# Patient Record
Sex: Female | Born: 1985 | Race: White | Hispanic: No | Marital: Married | Smoking: Former smoker
Health system: Southern US, Community
[De-identification: ages and names within clinical notes are randomized; demographics above are authoritative.]

## PROBLEM LIST (undated history)

## (undated) DIAGNOSIS — Z789 Other specified health status: Secondary | ICD-10-CM

## (undated) DIAGNOSIS — E785 Hyperlipidemia, unspecified: Secondary | ICD-10-CM

## (undated) DIAGNOSIS — K8050 Calculus of bile duct without cholangitis or cholecystitis without obstruction: Secondary | ICD-10-CM

## (undated) DIAGNOSIS — K219 Gastro-esophageal reflux disease without esophagitis: Secondary | ICD-10-CM

## (undated) DIAGNOSIS — N200 Calculus of kidney: Secondary | ICD-10-CM

## (undated) DIAGNOSIS — Z87442 Personal history of urinary calculi: Secondary | ICD-10-CM

## (undated) DIAGNOSIS — K8020 Calculus of gallbladder without cholecystitis without obstruction: Secondary | ICD-10-CM

## (undated) HISTORY — DX: Calculus of kidney: N20.0

## (undated) HISTORY — DX: Hyperlipidemia, unspecified: E78.5

---
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
## 2006-01-10 DIAGNOSIS — Z87410 Personal history of cervical dysplasia: Secondary | ICD-10-CM

## 2006-01-10 HISTORY — DX: Personal history of cervical dysplasia: Z87.410

## 2006-01-10 HISTORY — PX: CERVICAL CONIZATION W/BX: SHX1330

## 2006-06-30 ENCOUNTER — Emergency Department (HOSPITAL_COMMUNITY): Admission: EM | Admit: 2006-06-30 | Discharge: 2006-06-30 | Payer: Self-pay | Admitting: Emergency Medicine

## 2006-08-30 ENCOUNTER — Ambulatory Visit (HOSPITAL_COMMUNITY): Admission: RE | Admit: 2006-08-30 | Discharge: 2006-08-30 | Payer: Self-pay | Admitting: Obstetrics & Gynecology

## 2006-08-30 HISTORY — PX: LASER ABLATION OF THE CERVIX: SHX1949

## 2006-11-28 ENCOUNTER — Other Ambulatory Visit: Admission: RE | Admit: 2006-11-28 | Discharge: 2006-11-28 | Payer: Self-pay | Admitting: Obstetrics and Gynecology

## 2007-09-12 ENCOUNTER — Inpatient Hospital Stay (HOSPITAL_COMMUNITY): Admission: AD | Admit: 2007-09-12 | Discharge: 2007-09-12 | Payer: Self-pay | Admitting: Obstetrics & Gynecology

## 2007-09-16 ENCOUNTER — Inpatient Hospital Stay (HOSPITAL_COMMUNITY): Admission: AD | Admit: 2007-09-16 | Discharge: 2007-09-18 | Payer: Self-pay | Admitting: Obstetrics & Gynecology

## 2007-09-16 ENCOUNTER — Ambulatory Visit: Payer: Self-pay | Admitting: Obstetrics and Gynecology

## 2007-12-26 ENCOUNTER — Other Ambulatory Visit: Admission: RE | Admit: 2007-12-26 | Discharge: 2007-12-26 | Payer: Self-pay | Admitting: Obstetrics and Gynecology

## 2008-03-15 ENCOUNTER — Emergency Department (HOSPITAL_COMMUNITY): Admission: EM | Admit: 2008-03-15 | Discharge: 2008-03-15 | Payer: Self-pay | Admitting: Emergency Medicine

## 2008-03-19 ENCOUNTER — Ambulatory Visit (HOSPITAL_COMMUNITY): Admission: RE | Admit: 2008-03-19 | Discharge: 2008-03-19 | Payer: Self-pay | Admitting: Family Medicine

## 2009-02-02 ENCOUNTER — Other Ambulatory Visit: Admission: RE | Admit: 2009-02-02 | Discharge: 2009-02-02 | Payer: Self-pay | Admitting: Obstetrics and Gynecology

## 2009-08-19 ENCOUNTER — Ambulatory Visit: Payer: Self-pay | Admitting: Family Medicine

## 2009-08-19 ENCOUNTER — Inpatient Hospital Stay (HOSPITAL_COMMUNITY): Admission: AD | Admit: 2009-08-19 | Discharge: 2009-08-19 | Payer: Self-pay | Admitting: Obstetrics and Gynecology

## 2009-09-04 ENCOUNTER — Ambulatory Visit: Payer: Self-pay | Admitting: Advanced Practice Midwife

## 2009-09-04 ENCOUNTER — Inpatient Hospital Stay (HOSPITAL_COMMUNITY): Admission: AD | Admit: 2009-09-04 | Discharge: 2009-09-06 | Payer: Self-pay | Admitting: Obstetrics and Gynecology

## 2009-11-25 ENCOUNTER — Emergency Department (HOSPITAL_COMMUNITY): Admission: EM | Admit: 2009-11-25 | Discharge: 2009-11-25 | Payer: Self-pay | Admitting: Emergency Medicine

## 2010-02-17 ENCOUNTER — Other Ambulatory Visit (HOSPITAL_COMMUNITY)
Admission: RE | Admit: 2010-02-17 | Discharge: 2010-02-17 | Disposition: A | Discharge status: Home / Self Care | Payer: Self-pay | Source: Ambulatory Visit | Attending: Obstetrics and Gynecology | Admitting: Obstetrics and Gynecology

## 2010-02-17 ENCOUNTER — Other Ambulatory Visit: Payer: Self-pay | Admitting: Adult Health

## 2010-02-17 DIAGNOSIS — Z01419 Encounter for gynecological examination (general) (routine) without abnormal findings: Secondary | ICD-10-CM | POA: Insufficient documentation

## 2010-03-23 LAB — D-DIMER, QUANTITATIVE: D-Dimer, Quant: <0.22 ug/mL-FEU (ref 0.00–0.48)

## 2010-03-23 LAB — POCT I-STAT, CHEM 8
BUN: 21 mg/dL (ref 6–23)
Calcium, Ion: 1.14 mmol/L (ref 1.12–1.32)
Chloride: 105 mEq/L (ref 96–112)
Creatinine, Ser: 1 mg/dL (ref 0.4–1.2)
Glucose, Bld: 97 mg/dL (ref 70–99)
TCO2: 25 mmol/L (ref 0–100)

## 2010-03-26 LAB — CBC
HCT: 37.5 % (ref 36.0–46.0)
MCH: 27.5 pg (ref 26.0–34.0)
MCHC: 34 g/dL (ref 30.0–36.0)
MCV: 81.1 fL (ref 78.0–100.0)
Platelets: 198 10*3/uL (ref 150–400)
RDW: 16.3 % — ABNORMAL HIGH (ref 11.5–15.5)
WBC: 11.7 10*3/uL — ABNORMAL HIGH (ref 4.0–10.5)

## 2010-03-26 LAB — URINALYSIS, ROUTINE W REFLEX MICROSCOPIC
Bilirubin Urine: NEGATIVE
Glucose, UA: NEGATIVE mg/dL
Hgb urine dipstick: NEGATIVE
Ketones, ur: 15 mg/dL — AB
Protein, ur: NEGATIVE mg/dL
Urobilinogen, UA: 1 mg/dL (ref 0.0–1.0)

## 2010-03-26 LAB — URINE MICROSCOPIC-ADD ON

## 2010-04-22 LAB — D-DIMER, QUANTITATIVE: D-Dimer, Quant: 0.36 ug/mL-FEU (ref 0.00–0.48)

## 2010-05-25 NOTE — H&P (Signed)
NAME:  Nichole [name], MCCAIG NO.:  [id number]   MEDICAL RECORD NO.:  [id number]          PATIENT TYPE:  AMB   LOCATION:  DAY                           FACILITY:  [facility]   PHYSICIAN:  Lazaro Arms, M.D.   DATE OF BIRTH:  Oct 11, 1985   DATE OF ADMISSION:  DATE OF DISCHARGE:  LH                              HISTORY & PHYSICAL   Elvenia is a 25 year old white female who had a Pap smear which showed  atypical squamous cells with positive HPV.  That was actually obtained  at time of the pregnancy.  In the meantime, she suffered a pregnancy  loss and miscarriage.  And then after that I did a colposcopy with  biopsy.  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  As a result, she is admitted  for laser ablation oft cervix as an outpatient.   PAST MEDICAL HISTORY:  Negative.   PAST SURGICAL HISTORY:  Negative.   PAST OB HISTORY:  She has had one miscarriage.   ALLERGIES:  None.   MEDICATIONS:  None.   REVIEW OF SYSTEMS:  Negative.   PHYSICAL EXAMINATION:  HEENT:  Unremarkable.  NECK:  Thyroid is normal.  LUNGS:  Clear.  HEART:  Regular rate and rhythm without regurgitation or gallop.  BREASTS:  Deferred.  ABDOMEN:  Benign.  PELVIC:  Normal.  EXTREMITIES:  No edema.  NEUROLOGIC:  Grossly intact.   IMPRESSION:  High grade dysplasia.   PLAN:  The patient is admitted for laser ablation of the cervix.  She  understands the indications, risks, benefits and will proceed.      Lazaro Arms, M.D.  Electronically Signed     LHE/MEDQ  D:  08/29/2006  T:  08/30/2006  Job:  [id number]

## 2010-05-25 NOTE — Op Note (Signed)
NAMEREEYA, BOUND              ACCOUNT NO.:  [id number]   MEDICAL RECORD NO.:  [id number]          PATIENT TYPE:  AMB   LOCATION:  DAY                           FACILITY:  [facility]   PHYSICIAN:  Lazaro Arms, M.D.   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   PROCEDURE:  Laser ablation of cervix.   SURGEON:  Lazaro Arms, M.D.   ANESTHESIA:  General endotracheal.   FINDINGS:  The patient had a Pap smear that revealed ASCUS with positive  HPV. Did that colposcopically directed biopsies in the office which  revealed high-grade dysplasia with endocervical antral involvement.  As  a result she was admitted for laser ablation of cervix.   DESCRIPTION OF OPERATION:  The patient taken to the operating room,  placed in supine position underwent general endotracheal anesthesia,  placed in dorsal supine position.  Graves speculum was placed.  A  holmium laser was used power of 1.5 with rate of 20 per second. A good  margin was obtained around the dysplasia, taken to a depth 5-7 mm  conically.  There was no bleeding.  Dilute Monsel's placed end the  procedure.  The patient tolerated procedure well.  She is awakened from  anesthesia doing well.      Lazaro Arms, M.D.  Electronically Signed     LHE/MEDQ  D:  08/30/2006  T:  08/31/2006  Job:  [id number]

## 2010-10-13 LAB — CBC
Hemoglobin: 12.7
MCHC: 32.1
RBC: 5.11

## 2010-10-13 LAB — RPR: RPR Ser Ql: NONREACTIVE

## 2010-10-22 LAB — CBC
HCT: 36.7
Platelets: 333
RBC: 4.78
WBC: 10

## 2010-10-22 LAB — URINALYSIS, ROUTINE W REFLEX MICROSCOPIC
Nitrite: NEGATIVE
Specific Gravity, Urine: 1.02
Urobilinogen, UA: 0.2
pH: 6

## 2010-10-22 LAB — URINE MICROSCOPIC-ADD ON

## 2010-10-22 LAB — HCG, QUANTITATIVE, PREGNANCY: hCG, Beta Chain, Quant, S: <2

## 2011-01-11 NOTE — L&D Delivery Note (Addendum)
Delivery Note At 8:03 AM a viable female was delivered via Vaginal, Spontaneous Delivery (Presentation: Right Occiput Anterior) through loose nuchal cord after approximately 1.67min shoulder dystocia. 1st maneuver: mcroberts, 2nd maneuver: mcroberts w/ suprapubic pressure, 3rd maneuver: manual rotation of posterior shoulder with subsequent release of anterior shoulder.    Infant moving both arms symmetrically.  APGAR: 8, 9; weight 8 lb 12.9 oz (3995 g).   Placenta status: Intact, Spontaneous.  Cord: 3 vessels with hypospirality    Anesthesia: Epidural  Episiotomy: None Lacerations: 2nd degree Suture Repair: 3.66monocryl Est. Blood Loss (mL):  Mom to postpartum.  Baby to nursery-stable.  Plans to bottlefeed, vasectomy for contraception.  OP circumcision at FT.   Nichole [name], Nichole [name] 11/27/2011, 8:34 AM

## 2011-03-01 ENCOUNTER — Other Ambulatory Visit (HOSPITAL_COMMUNITY)
Admission: RE | Admit: 2011-03-01 | Discharge: 2011-03-01 | Disposition: A | Discharge status: Home / Self Care | Payer: Managed Care, Other (non HMO) | Source: Ambulatory Visit | Attending: Obstetrics and Gynecology | Admitting: Obstetrics and Gynecology

## 2011-03-01 ENCOUNTER — Other Ambulatory Visit: Payer: Self-pay | Admitting: Adult Health

## 2011-03-01 DIAGNOSIS — Z01419 Encounter for gynecological examination (general) (routine) without abnormal findings: Secondary | ICD-10-CM | POA: Insufficient documentation

## 2011-04-05 LAB — OB RESULTS CONSOLE ABO/RH: RH Type: POSITIVE

## 2011-04-05 LAB — OB RESULTS CONSOLE ANTIBODY SCREEN: Antibody Screen: NEGATIVE

## 2011-04-05 LAB — OB RESULTS CONSOLE HEPATITIS B SURFACE ANTIGEN: Hepatitis B Surface Ag: NEGATIVE

## 2011-04-05 LAB — OB RESULTS CONSOLE RPR: RPR: NONREACTIVE

## 2011-11-26 ENCOUNTER — Encounter (HOSPITAL_COMMUNITY): Payer: Self-pay | Admitting: *Deleted

## 2011-11-26 ENCOUNTER — Inpatient Hospital Stay (HOSPITAL_COMMUNITY)
Admission: AD | Admit: 2011-11-26 | Discharge: 2011-11-26 | Disposition: A | Discharge status: Home / Self Care | Payer: Managed Care, Other (non HMO) | Source: Ambulatory Visit | Attending: Obstetrics & Gynecology | Admitting: Obstetrics & Gynecology

## 2011-11-26 DIAGNOSIS — O479 False labor, unspecified: Secondary | ICD-10-CM | POA: Insufficient documentation

## 2011-11-26 HISTORY — DX: Other specified health status: Z78.9

## 2011-11-26 MED ORDER — PROMETHAZINE HCL 25 MG PO TABS: 25.0000 mg | ORAL_TABLET | Freq: Four times a day (QID) | ORAL | Status: DC | PRN

## 2011-11-26 NOTE — MAU Note (Signed)
Contractions started @0330 .  Getting stronger and closer, now every 3-5 min.  No bleeding or leaking, was 1 cm when last checked.

## 2011-11-27 ENCOUNTER — Encounter (HOSPITAL_COMMUNITY): Payer: Self-pay | Admitting: Family Medicine

## 2011-11-27 ENCOUNTER — Inpatient Hospital Stay (HOSPITAL_COMMUNITY): Payer: Managed Care, Other (non HMO) | Admitting: Anesthesiology

## 2011-11-27 ENCOUNTER — Inpatient Hospital Stay (HOSPITAL_COMMUNITY)
Admission: AD | Admit: 2011-11-27 | Discharge: 2011-11-28 | DRG: 775 | Disposition: A | Discharge status: Home / Self Care | Payer: Managed Care, Other (non HMO) | Source: Ambulatory Visit | Attending: Obstetrics & Gynecology | Admitting: Obstetrics & Gynecology

## 2011-11-27 ENCOUNTER — Encounter (HOSPITAL_COMMUNITY): Payer: Self-pay | Admitting: Anesthesiology

## 2011-11-27 LAB — CBC
MCV: 77.6 fL — ABNORMAL LOW (ref 78.0–100.0)
Platelets: 214 10*3/uL (ref 150–400)
RBC: 5.18 MIL/uL — ABNORMAL HIGH (ref 3.87–5.11)
RDW: 15.4 % (ref 11.5–15.5)
WBC: 16.8 10*3/uL — ABNORMAL HIGH (ref 4.0–10.5)

## 2011-11-27 LAB — RPR: RPR Ser Ql: NONREACTIVE

## 2011-11-27 MED ORDER — OXYTOCIN BOLUS FROM INFUSION
500.0000 mL | INTRAVENOUS | Status: DC
  Administered 2011-11-27: 500 mL via INTRAVENOUS

## 2011-11-27 MED ORDER — FLEET ENEMA 7-19 GM/118ML RE ENEM: 1.0000 | ENEMA | Freq: Every day | RECTAL | Status: DC | PRN

## 2011-11-27 MED ORDER — LANOLIN HYDROUS EX OINT: TOPICAL_OINTMENT | CUTANEOUS | Status: DC | PRN

## 2011-11-27 MED ORDER — ACETAMINOPHEN 325 MG PO TABS: 650.0000 mg | ORAL_TABLET | ORAL | Status: DC | PRN

## 2011-11-27 MED ORDER — OXYCODONE-ACETAMINOPHEN 5-325 MG PO TABS: 1.0000 | ORAL_TABLET | ORAL | Status: DC | PRN

## 2011-11-27 MED ORDER — CITRIC ACID-SODIUM CITRATE 334-500 MG/5ML PO SOLN: 30.0000 mL | ORAL | Status: DC | PRN

## 2011-11-27 MED ORDER — BENZOCAINE-MENTHOL 20-0.5 % EX AERO
1.0000 "application " | INHALATION_SPRAY | CUTANEOUS | Status: DC | PRN
  Administered 2011-11-27: 1 via TOPICAL
  Filled 2011-11-27: qty 56

## 2011-11-27 MED ORDER — TETANUS-DIPHTH-ACELL PERTUSSIS 5-2.5-18.5 LF-MCG/0.5 IM SUSP: 0.5000 mL | Freq: Once | INTRAMUSCULAR | Status: DC

## 2011-11-27 MED ORDER — FENTANYL 2.5 MCG/ML BUPIVACAINE 1/10 % EPIDURAL INFUSION (WH - ANES)
14.0000 mL/h | INTRAMUSCULAR | Status: DC
  Administered 2011-11-27: 14 mL/h via EPIDURAL
  Filled 2011-11-27: qty 125

## 2011-11-27 MED ORDER — BISACODYL 10 MG RE SUPP: 10.0000 mg | Freq: Every day | RECTAL | Status: DC | PRN

## 2011-11-27 MED ORDER — LACTATED RINGERS IV SOLN
INTRAVENOUS | Status: DC
  Administered 2011-11-27: 125 mL/h via INTRAVENOUS

## 2011-11-27 MED ORDER — ONDANSETRON HCL 4 MG PO TABS: 4.0000 mg | ORAL_TABLET | ORAL | Status: DC | PRN

## 2011-11-27 MED ORDER — IBUPROFEN 600 MG PO TABS: 600.0000 mg | ORAL_TABLET | Freq: Four times a day (QID) | ORAL | Status: DC | PRN

## 2011-11-27 MED ORDER — IBUPROFEN 600 MG PO TABS
600.0000 mg | ORAL_TABLET | Freq: Four times a day (QID) | ORAL | Status: DC
  Administered 2011-11-27 – 2011-11-28 (×5): 600 mg via ORAL
  Filled 2011-11-27 (×5): qty 1

## 2011-11-27 MED ORDER — ONDANSETRON HCL 4 MG/2ML IJ SOLN: 4.0000 mg | INTRAMUSCULAR | Status: DC | PRN

## 2011-11-27 MED ORDER — LIDOCAINE HCL (PF) 1 % IJ SOLN
INTRAMUSCULAR | Status: DC | PRN
  Administered 2011-11-27 (×4): 4 mL

## 2011-11-27 MED ORDER — SODIUM CHLORIDE 0.9 % IV SOLN: 250.0000 mL | INTRAVENOUS | Status: DC | PRN

## 2011-11-27 MED ORDER — SODIUM CHLORIDE 0.9 % IJ SOLN: 3.0000 mL | INTRAMUSCULAR | Status: DC | PRN

## 2011-11-27 MED ORDER — MEASLES, MUMPS & RUBELLA VAC ~~LOC~~ INJ: 0.5000 mL | INJECTION | Freq: Once | SUBCUTANEOUS | Status: DC

## 2011-11-27 MED ORDER — WITCH HAZEL-GLYCERIN EX PADS: 1.0000 "application " | MEDICATED_PAD | CUTANEOUS | Status: DC | PRN

## 2011-11-27 MED ORDER — EPHEDRINE 5 MG/ML INJ: 10.0000 mg | INTRAVENOUS | Status: DC | PRN

## 2011-11-27 MED ORDER — PHENYLEPHRINE 40 MCG/ML (10ML) SYRINGE FOR IV PUSH (FOR BLOOD PRESSURE SUPPORT)
80.0000 ug | PREFILLED_SYRINGE | INTRAVENOUS | Status: DC | PRN
  Filled 2011-11-27: qty 5

## 2011-11-27 MED ORDER — SENNOSIDES-DOCUSATE SODIUM 8.6-50 MG PO TABS
2.0000 | ORAL_TABLET | Freq: Every day | ORAL | Status: DC
  Administered 2011-11-27: 2 via ORAL

## 2011-11-27 MED ORDER — ONDANSETRON HCL 4 MG/2ML IJ SOLN: 4.0000 mg | Freq: Four times a day (QID) | INTRAMUSCULAR | Status: DC | PRN

## 2011-11-27 MED ORDER — ZOLPIDEM TARTRATE 5 MG PO TABS: 5.0000 mg | ORAL_TABLET | Freq: Every evening | ORAL | Status: DC | PRN

## 2011-11-27 MED ORDER — SIMETHICONE 80 MG PO CHEW: 80.0000 mg | CHEWABLE_TABLET | ORAL | Status: DC | PRN

## 2011-11-27 MED ORDER — OXYTOCIN 40 UNITS IN LACTATED RINGERS INFUSION - SIMPLE MED
62.5000 mL/h | INTRAVENOUS | Status: DC
Start: 2011-11-27 — End: 2011-11-27
  Filled 2011-11-27: qty 1000

## 2011-11-27 MED ORDER — FLEET ENEMA 7-19 GM/118ML RE ENEM: 1.0000 | ENEMA | RECTAL | Status: DC | PRN

## 2011-11-27 MED ORDER — DIPHENHYDRAMINE HCL 25 MG PO CAPS: 25.0000 mg | ORAL_CAPSULE | Freq: Four times a day (QID) | ORAL | Status: DC | PRN

## 2011-11-27 MED ORDER — PRENATAL MULTIVITAMIN CH
1.0000 | ORAL_TABLET | Freq: Every day | ORAL | Status: DC
  Administered 2011-11-27 – 2011-11-28 (×2): 1 via ORAL
  Filled 2011-11-27 (×2): qty 1

## 2011-11-27 MED ORDER — SODIUM CHLORIDE 0.9 % IJ SOLN: 3.0000 mL | Freq: Two times a day (BID) | INTRAMUSCULAR | Status: DC

## 2011-11-27 MED ORDER — DIBUCAINE 1 % RE OINT: 1.0000 "application " | TOPICAL_OINTMENT | RECTAL | Status: DC | PRN

## 2011-11-27 MED ORDER — DIPHENHYDRAMINE HCL 50 MG/ML IJ SOLN: 12.5000 mg | INTRAMUSCULAR | Status: DC | PRN

## 2011-11-27 MED ORDER — EPHEDRINE 5 MG/ML INJ
10.0000 mg | INTRAVENOUS | Status: DC | PRN
  Filled 2011-11-27: qty 4

## 2011-11-27 MED ORDER — LACTATED RINGERS IV SOLN: 500.0000 mL | INTRAVENOUS | Status: DC | PRN

## 2011-11-27 MED ORDER — PHENYLEPHRINE 40 MCG/ML (10ML) SYRINGE FOR IV PUSH (FOR BLOOD PRESSURE SUPPORT): 80.0000 ug | PREFILLED_SYRINGE | INTRAVENOUS | Status: DC | PRN

## 2011-11-27 MED ORDER — PANTOPRAZOLE SODIUM 40 MG PO TBEC
40.0000 mg | DELAYED_RELEASE_TABLET | Freq: Every day | ORAL | Status: DC
  Filled 2011-11-27 (×2): qty 1

## 2011-11-27 MED ORDER — LIDOCAINE HCL (PF) 1 % IJ SOLN
30.0000 mL | INTRAMUSCULAR | Status: DC | PRN
  Administered 2011-11-27: 30 mL via SUBCUTANEOUS
  Filled 2011-11-27: qty 30

## 2011-11-27 MED ORDER — LACTATED RINGERS IV SOLN
500.0000 mL | Freq: Once | INTRAVENOUS | Status: AC
  Administered 2011-11-27: 500 mL via INTRAVENOUS

## 2011-11-27 MED ORDER — OXYTOCIN 40 UNITS IN LACTATED RINGERS INFUSION - SIMPLE MED: 62.5000 mL/h | INTRAVENOUS | Status: DC | PRN

## 2011-11-27 NOTE — MAU Note (Signed)
Patient is in for labor eval. She states that she was seen in mau at 8am and was 2.5cm. She states that she is having ctx q2-17m since midnight. She denies lof or vaginal bleeding. She reports good fetal movement.

## 2011-11-27 NOTE — Anesthesia Procedure Notes (Signed)
Epidural Patient location during procedure: OB Start time: 11/27/2011 6:08 AM  Staffing Performed by: anesthesiologist   Preanesthetic Checklist Completed: patient identified, site marked, surgical consent, pre-op evaluation, timeout performed, IV checked, risks and benefits discussed and monitors and equipment checked  Epidural Patient position: sitting Prep: site prepped and draped and DuraPrep Patient monitoring: continuous pulse ox and blood pressure Approach: midline Injection technique: LOR air  Needle:  Needle type: Tuohy  Needle gauge: 17 G Needle length: 9 cm and 9 Needle insertion depth: 6 cm Catheter type: closed end flexible Catheter size: 19 Gauge Catheter at skin depth: 11 cm Test dose: negative  Assessment Events: blood not aspirated, injection not painful, no injection resistance, negative IV test and no paresthesia  Additional Notes Discussed risk of headache, infection, bleeding, nerve injury and failed or incomplete block.  Patient voices understanding and wishes to proceed. Reason for block:procedure for pain

## 2011-11-27 NOTE — Anesthesia Postprocedure Evaluation (Signed)
  Anesthesia Post-op Note  Patient: Nichole [name]  Procedure(s) Performed: * No procedures listed *  Patient Location: Mother/Baby  Anesthesia Type:Epidural  Level of Consciousness: awake, alert  and oriented  Airway and Oxygen Therapy: Patient Spontanous Breathing  Post-op Pain: mild  Post-op Assessment: Patient's Cardiovascular Status Stable, Respiratory Function Stable, Patent Airway, No signs of Nausea or vomiting and Pain level controlled  Post-op Vital Signs: stable  Complications: No apparent anesthesia complications

## 2011-11-27 NOTE — Progress Notes (Signed)
Nichole [name] is a 26 y.o. [phone] at [redacted]w[redacted]d admitted for active labor  Subjective: Comfortable w/ epidural, starting to feel a little pressure.  Husband supportive at bs.  Objective: BP 102/64  Pulse 114  Temp 98 F (36.7 C) (Oral)  Resp 16  Ht 5\' 4"  (1.626 m)  Wt 84.086 kg (185 lb 6 oz)  BMI 31.82 kg/m2  SpO2 100%      FHT:  FHR: 140 bpm, variability: moderate,  accelerations:  Present,  decelerations:  Present earlies UC:   irregular, every 2-5 minutes SVE:   Dilation: 6.5 Effacement (%): 90 Station: 0 Exam by:: K.Aayush Gelpi CNM AROM small amount clear fluid  Labs: Lab Results  Component Value Date   WBC 16.8* 11/27/2011   HGB 13.3 11/27/2011   HCT 40.2 11/27/2011   MCV 77.6* 11/27/2011   PLT 214 11/27/2011    Assessment / Plan: Spontaneous labor, progressing normally, now arom'd  Labor: Progressing normally Preeclampsia:  n/a Fetal Wellbeing:  Category I Pain Control:  Epidural I/D:  n/a Anticipated MOD:  NSVD  Kaydon, Kalbfleisch 11/27/2011, 7:12 AM

## 2011-11-27 NOTE — H&P (Signed)
Nichole [name] is a 26 y.o. [phone] female at [redacted]w[redacted]d by 6wk2d u/s, presenting for uc's that began yesterday that have progressively increased in frequency and intensity.  Denies LOF or VB. Good fm.  Was evaluated in MAU 11/16 am, was 2.5/50/-2 and didn't make cervical change so was d/c'd. Has now changed from 4/90/-2 to 4.5/90/-2 in an hour.  Regular pnc @ FT beginning at [facility].  Normal AFP and anatomy u/s, 2hr gtt 95,621,308, GBS neg.  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 H/O term svd x 2, pelvis proven to 9lb10oz.  Maternal Medical History:  Reason for admission: Reason for admission: contractions.  Contractions: Onset was yesterday.   Frequency: rare.   Perceived severity is moderate.    Fetal activity: Perceived fetal activity is normal.   Last perceived fetal movement was within the past hour.    Prenatal Complications - Diabetes: none.    OB History    Grav Para Term Preterm Abortions TAB SAB Ect Mult Living   4 2 2  0 1 0 1 0 0 2     Past Medical History  Diagnosis Date  . No pertinent past medical history    History reviewed. No pertinent past surgical history. Family History: family history is not on file. Social History:  reports that she has never smoked. She does not have any smokeless tobacco history on file. She reports that she does not drink alcohol or use illicit drugs.   Prenatal Transfer Tool  Maternal Diabetes: No Genetic Screening: Normal Maternal Ultrasounds/Referrals: Normal Fetal Ultrasounds or other Referrals:  None Maternal Substance Abuse:  No Significant Maternal Medications:  None Significant Maternal Lab Results:  Lab values include: Group B Strep negative Other Comments:  Heterozygous CF carrier, FOB neg  Review of Systems  Constitutional: Negative.   HENT: Negative.   Eyes: Negative.   Respiratory: Negative.   Cardiovascular: Negative.   Gastrointestinal: Positive for abdominal pain (r/t uc's).  Genitourinary: Negative.     Musculoskeletal: Negative.   Skin: Negative.   Neurological: Negative.   Endo/Heme/Allergies: Negative.   Psychiatric/Behavioral: Negative.     Dilation: 4.5 Effacement (%): 90 Station: -2 Exam by:: Peace, rn Blood pressure 115/71, pulse 102, temperature 97.7 F (36.5 C), temperature source Oral, resp. rate 18, height 5\' 4"  (1.626 m), weight 84.086 kg (185 lb 6 oz), SpO2 98.00%. Maternal Exam:  Uterine Assessment: Contraction strength is moderate.  Contraction frequency is regular.   Abdomen: Patient reports no abdominal tenderness. Fetal presentation: vertex     Fetal Exam Fetal Monitor Review: Mode: ultrasound.   Baseline rate: 145.  Variability: moderate (6-25 bpm).   Pattern: accelerations present and early decelerations.    Fetal State Assessment: Category I - tracings are normal.     Physical Exam  Constitutional: She is oriented to person, place, and time. She appears well-developed and well-nourished.  HENT:  Head: Normocephalic.  Neck: Normal range of motion.  Cardiovascular: Normal rate and regular rhythm.   Respiratory: Effort normal and breath sounds normal.  GI: Soft.       gravid  Genitourinary:       SVE by RN  Musculoskeletal: Normal range of motion.  Neurological: She is alert and oriented to person, place, and time. She has normal reflexes.  Skin: Skin is warm and dry.  Psychiatric: She has a normal mood and affect. Her behavior is normal. Judgment and thought content normal.    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

## 2011-11-27 NOTE — Anesthesia Preprocedure Evaluation (Signed)
Anesthesia Evaluation  Patient identified by MRN, date of birth, ID band Patient awake    Reviewed: Allergy & Precautions, H&P , NPO status , Patient's Chart, lab work & pertinent test results, reviewed documented beta blocker date and time   History of Anesthesia Complications Negative for: history of anesthetic complications  Airway Mallampati: I TM Distance: >3 FB Neck ROM: full    Dental  (+) Teeth Intact   Pulmonary neg pulmonary ROS,  breath sounds clear to auscultation        Cardiovascular negative cardio ROS  Rhythm:regular Rate:Normal     Neuro/Psych negative neurological ROS  negative psych ROS   GI/Hepatic Neg liver ROS, GERD-  Medicated,  Endo/Other  negative endocrine ROS  Renal/GU negative Renal ROS     Musculoskeletal   Abdominal   Peds  Hematology negative hematology ROS (+)   Anesthesia Other Findings   Reproductive/Obstetrics (+) Pregnancy                           Anesthesia Physical Anesthesia Plan  ASA: II  Anesthesia Plan: Epidural   Post-op Pain Management:    Induction:   Airway Management Planned:   Additional Equipment:   Intra-op Plan:   Post-operative Plan:   Informed Consent: I have reviewed the patients History and Physical, chart, labs and discussed the procedure including the risks, benefits and alternatives for the proposed anesthesia with the patient or authorized representative who has indicated his/her understanding and acceptance.     Plan Discussed with:   Anesthesia Plan Comments:         Anesthesia Quick Evaluation  

## 2011-11-28 MED ORDER — IBUPROFEN 600 MG PO TABS: 600.0000 mg | ORAL_TABLET | Freq: Four times a day (QID) | ORAL | Status: DC

## 2011-11-28 NOTE — Discharge Summary (Signed)
Obstetric Discharge Summary Reason for Admission: uterine contractions Prenatal Procedures: none Intrapartum Procedures: spontaneous vaginal delivery and shoulder dystocia x1.5 min, released with McRoberts/suprapubic pressure and rotation of post shoulder Postpartum Procedures: none Complications-Operative and Postpartum: vaginal laceration Hemoglobin  Date Value Range Status  11/27/2011 13.3  12.0 - 15.0 g/dL Final     HCT  Date Value Range Status  11/27/2011 40.2  36.0 - 46.0 % Final    Physical Exam:  General: alert, cooperative and no distress Lochia: appropriate Uterine Fundus: firm DVT Evaluation: No evidence of DVT seen on physical exam.  Discharge Diagnoses: Term Pregnancy-delivered  Discharge Information: Date: 11/28/2011 Activity: pelvic rest Diet: routine Medications: PNV and Ibuprofen Condition: stable Instructions: refer to practice specific booklet Discharge to: home Follow-up Information    Follow up with [facility]. Schedule an appointment as soon as possible for a visit in 4 weeks.   Contact information:   [address] [place] [address] [phone]         Newborn Data: Live born female  Birth Weight: 8 lb 12.9 oz (3995 g) APGAR: 8, 9  Home with mother. F/u with [facility] for outpt circ.  Street, Nichole [name] 11/28/2011, 9:09 AM  I have seen and examined this patient and I agree with the above. Nichole [name], Nichole [name] 9:37 PM 11/29/2011

## 2011-12-01 NOTE — Discharge Summary (Signed)
Attestation of Attending Supervision of Advanced Practitioner: Evaluation and management procedures were performed by the PA/NP/CNM/OB Fellow under my supervision/collaboration. Chart reviewed and agree with management and plan.  Danitza Schoenfeldt V 12/01/2011 5:21 AM    

## 2011-12-12 NOTE — H&P (Signed)
Agree with above note.  Nichole [name] H. 12/12/2011 9:24 PM

## 2012-03-28 ENCOUNTER — Encounter: Payer: Self-pay | Admitting: Adult Health

## 2012-03-28 ENCOUNTER — Ambulatory Visit (INDEPENDENT_AMBULATORY_CARE_PROVIDER_SITE_OTHER): Payer: Managed Care, Other (non HMO) | Admitting: Adult Health

## 2012-03-28 VITALS — BP 110/72 | Resp 20 | Ht 65.0 in | Wt 179.0 lb

## 2012-03-28 DIAGNOSIS — IMO0001 Reserved for inherently not codable concepts without codable children: Secondary | ICD-10-CM

## 2012-03-28 DIAGNOSIS — Z3041 Encounter for surveillance of contraceptive pills: Secondary | ICD-10-CM | POA: Insufficient documentation

## 2012-03-28 MED ORDER — NORGESTIM-ETH ESTRAD TRIPHASIC 0.18/0.215/0.25 MG-25 MCG PO TABS: 1.0000 | ORAL_TABLET | Freq: Every day | ORAL | Status: DC

## 2012-03-28 NOTE — Progress Notes (Signed)
Nichole [name] is here today to restart birth control pills. Her husband was planning a vasectomy and that did not happen. She will start tri-Sprintec today, and she is encouraged to use condoms for 2 weeks. Impression: Contraceptive management. Plan: Start pills today and use condoms. Any problems

## 2012-03-28 NOTE — Patient Instructions (Signed)
Start pills today, use condoms for 2 weeks call any problems

## 2012-04-30 ENCOUNTER — Telehealth: Payer: Self-pay | Admitting: Adult Health

## 2012-05-01 NOTE — Telephone Encounter (Signed)
Pt states having itching in her face, hands, and ears x 2 days, thinks it could be her BCP, Orthotricycline, stated has not changed soap or detergents. Not having itching this morning.  Could this be a side effect of the BCP? Please advise.

## 2012-05-01 NOTE — Telephone Encounter (Signed)
Had itching on face and hands for about 3 days in a row, none today. Has been on OCs for 5 weeks. No changes in routine, she thinks now it could be the pollen or allergies. And I agree.

## 2012-08-29 ENCOUNTER — Other Ambulatory Visit (HOSPITAL_COMMUNITY): Payer: Self-pay | Admitting: Physician Assistant

## 2012-08-29 DIAGNOSIS — R109 Unspecified abdominal pain: Secondary | ICD-10-CM

## 2012-08-31 ENCOUNTER — Ambulatory Visit (HOSPITAL_COMMUNITY)
Admission: RE | Admit: 2012-08-31 | Discharge: 2012-08-31 | Disposition: A | Discharge status: Home / Self Care | Payer: Managed Care, Other (non HMO) | Source: Ambulatory Visit | Attending: Physician Assistant | Admitting: Physician Assistant

## 2012-08-31 DIAGNOSIS — R110 Nausea: Secondary | ICD-10-CM | POA: Insufficient documentation

## 2012-08-31 DIAGNOSIS — R109 Unspecified abdominal pain: Secondary | ICD-10-CM

## 2012-08-31 DIAGNOSIS — K8020 Calculus of gallbladder without cholecystitis without obstruction: Secondary | ICD-10-CM | POA: Insufficient documentation

## 2012-11-27 ENCOUNTER — Ambulatory Visit (INDEPENDENT_AMBULATORY_CARE_PROVIDER_SITE_OTHER): Payer: Managed Care, Other (non HMO) | Admitting: Adult Health

## 2012-11-27 ENCOUNTER — Encounter: Payer: Self-pay | Admitting: Adult Health

## 2012-11-27 VITALS — BP 122/80 | Ht 65.0 in | Wt 171.0 lb

## 2012-11-27 DIAGNOSIS — N926 Irregular menstruation, unspecified: Secondary | ICD-10-CM | POA: Insufficient documentation

## 2012-11-27 MED ORDER — NORGESTIMATE-ETH ESTRADIOL 0.25-35 MG-MCG PO TABS: 1.0000 | ORAL_TABLET | Freq: Every day | ORAL | Status: DC

## 2012-11-27 NOTE — Patient Instructions (Signed)
Finish current pack of pills and then start sprintec Follow up prn Use condom

## 2012-11-27 NOTE — Progress Notes (Signed)
Subjective:     Patient ID: Nichole [name], female   DOB: [birth year], 27 y.o.   MRN: [id number]  HPI Nichole [name] is a 27 year old white female married in complaining of having irregular bleeding on the pill.No pain, no missed pills or other meds.  Review of Systems See HPI Reviewed past medical,surgical, social and family history. Reviewed medications and allergies.     Objective:   Physical Exam BP 122/80  Ht 5\' 5"  (1.651 m)  Wt 171 lb (77.565 kg)  BMI 28.46 kg/m2  LMP 11/19/2012  Breastfeeding? No Skin warm and dry.Pelvic: external genitalia is normal in appearance, vagina: period like blood without odor, cervix:smooth and bulbous, uterus: normal size, shape and contour, non tender, no masses felt, adnexa: no masses or tenderness noted.    Assessment:    Irregular bleeding    Plan:    Rx sprintec x 1 year, finish current pack of pills then start sprintec, use condoms  Follow up prn, but give it 3 months to see if resumes normal cycle

## 2013-11-01 ENCOUNTER — Other Ambulatory Visit: Payer: Self-pay | Admitting: Adult Health

## 2013-11-11 ENCOUNTER — Encounter: Payer: Self-pay | Admitting: Adult Health

## 2014-08-06 ENCOUNTER — Telehealth: Payer: Self-pay | Admitting: Adult Health

## 2014-08-06 ENCOUNTER — Telehealth: Payer: Self-pay | Admitting: Internal Medicine

## 2014-08-06 MED ORDER — NORGESTIMATE-ETH ESTRADIOL 0.25-35 MG-MCG PO TABS: 1.0000 | ORAL_TABLET | Freq: Every day | ORAL | Status: DC

## 2014-08-06 NOTE — Telephone Encounter (Signed)
Will refill 3 packs at a time

## 2014-08-06 NOTE — Telephone Encounter (Signed)
Error, wrong patient

## 2014-08-06 NOTE — Telephone Encounter (Signed)
Pt states due to insurance change needs OCP (Mono-Linyah) 90 supply sent to [facility], [place].

## 2014-10-23 ENCOUNTER — Other Ambulatory Visit: Payer: Managed Care, Other (non HMO) | Admitting: Adult Health

## 2014-10-29 ENCOUNTER — Encounter: Payer: Self-pay | Admitting: Adult Health

## 2014-10-29 ENCOUNTER — Ambulatory Visit (INDEPENDENT_AMBULATORY_CARE_PROVIDER_SITE_OTHER): Payer: Managed Care, Other (non HMO) | Admitting: Adult Health

## 2014-10-29 ENCOUNTER — Other Ambulatory Visit (HOSPITAL_COMMUNITY)
Admission: RE | Admit: 2014-10-29 | Discharge: 2014-10-29 | Disposition: A | Discharge status: Home / Self Care | Payer: Managed Care, Other (non HMO) | Source: Ambulatory Visit | Attending: Adult Health | Admitting: Adult Health

## 2014-10-29 VITALS — BP 130/90 | HR 80 | Ht 63.0 in | Wt 181.0 lb

## 2014-10-29 DIAGNOSIS — Z01419 Encounter for gynecological examination (general) (routine) without abnormal findings: Secondary | ICD-10-CM | POA: Diagnosis not present

## 2014-10-29 DIAGNOSIS — Z3041 Encounter for surveillance of contraceptive pills: Secondary | ICD-10-CM

## 2014-10-29 DIAGNOSIS — Z1151 Encounter for screening for human papillomavirus (HPV): Secondary | ICD-10-CM | POA: Diagnosis not present

## 2014-10-29 MED ORDER — NORGESTIMATE-ETH ESTRADIOL 0.25-35 MG-MCG PO TABS: 1.0000 | ORAL_TABLET | Freq: Every day | ORAL | Status: DC

## 2014-10-29 NOTE — Patient Instructions (Signed)
Physical in 1 year Continue OCs  Mammogram at 1440

## 2014-10-29 NOTE — Progress Notes (Signed)
Patient ID: Nichole [name], female   DOB: 03/11/1985, 29 y.o.   MRN: [id number] History of Present Illness:  Nichole [name] is a 29 year old white female,married in for a well woman gyn exam and pap.She is happy with her OCs. PCP is [facility].  Current Medications, Allergies, Past Medical History, Past Surgical History, Family History and Social History were reviewed in [facility] electronic medical record.     Review of Systems:  Patient denies any headaches, hearing loss, fatigue, blurred vision, shortness of breath, chest pain, abdominal pain, problems with bowel movements, urination, or intercourse. No joint pain or mood swings.   Physical Exam:BP 130/90 mmHg  Pulse 80  Ht 5\' 3"  (1.6 m)  Wt 181 lb (82.101 kg)  BMI 32.07 kg/m2  LMP 10/03/2014 General:  Well developed, well nourished, no acute distress Skin:  Warm and dry Neck:  Midline trachea, normal thyroid, good ROM, no lymphadenopathy Lungs; Clear to auscultation bilaterally Breast:  No dominant palpable mass, retraction, or nipple discharge Cardiovascular: Regular rate and rhythm Abdomen:  Soft, non tender, no hepatosplenomegaly Pelvic:  External genitalia is normal in appearance, no lesions.  The vagina is normal in appearance. Urethra has no lesions or masses. The cervix is bulbous. Pap with HPV performed. Uterus is felt to be normal size, shape, and contour.  No adnexal masses or tenderness noted.Bladder is non tender, no masses felt. Extremities/musculoskeletal:  No swelling or varicosities noted, no clubbing or cyanosis Psych:  No mood changes, alert and cooperative,seems happy   Impression: Well woman gyn exam and pap Contraceptive management    Plan: Refilled sprintec x 1 year Physical in 1 year, pap in 3 if normal with negative HPV Mammogram at 40  Labs with PCP

## 2014-11-03 LAB — CYTOLOGY - PAP

## 2015-11-02 ENCOUNTER — Other Ambulatory Visit: Payer: Managed Care, Other (non HMO) | Admitting: Adult Health

## 2015-11-05 ENCOUNTER — Ambulatory Visit (INDEPENDENT_AMBULATORY_CARE_PROVIDER_SITE_OTHER): Payer: Managed Care, Other (non HMO) | Admitting: Adult Health

## 2015-11-05 ENCOUNTER — Encounter: Payer: Self-pay | Admitting: Adult Health

## 2015-11-05 VITALS — BP 132/80 | HR 74 | Ht 63.0 in | Wt 189.5 lb

## 2015-11-05 DIAGNOSIS — Z01419 Encounter for gynecological examination (general) (routine) without abnormal findings: Secondary | ICD-10-CM | POA: Diagnosis not present

## 2015-11-05 DIAGNOSIS — Z3041 Encounter for surveillance of contraceptive pills: Secondary | ICD-10-CM

## 2015-11-05 LAB — LIPID PANEL
Cholesterol: 199 mg/dL (ref 125–200)
HDL: 43 mg/dL — ABNORMAL LOW (ref >=46)
LDL CALC: 125 mg/dL (ref <130)
TRIGLYCERIDES: 156 mg/dL — AB (ref <150)
Total CHOL/HDL Ratio: 4.6 Ratio (ref <=5.0)
VLDL: 31 mg/dL — AB (ref <30)

## 2015-11-05 LAB — CBC
HCT: 42.7 % (ref 35.0–45.0)
Hemoglobin: 13.9 g/dL (ref 11.7–15.5)
MCH: 26.9 pg — ABNORMAL LOW (ref 27.0–33.0)
MCHC: 32.6 g/dL (ref 32.0–36.0)
MCV: 82.8 fL (ref 80.0–100.0)
MPV: 10 fL (ref 7.5–12.5)
PLATELETS: 347 10*3/uL (ref 140–400)
RBC: 5.16 MIL/uL — ABNORMAL HIGH (ref 3.80–5.10)
RDW: 13.4 % (ref 11.0–15.0)
WBC: 8.7 10*3/uL (ref 3.8–10.8)

## 2015-11-05 LAB — COMPREHENSIVE METABOLIC PANEL
ALBUMIN: 4.3 g/dL (ref 3.6–5.1)
ALK PHOS: 88 U/L (ref 33–115)
ALT: 12 U/L (ref 6–29)
AST: 15 U/L (ref 10–30)
BUN: 11 mg/dL (ref 7–25)
CALCIUM: 9.5 mg/dL (ref 8.6–10.2)
CO2: 22 mmol/L (ref 20–31)
CREATININE: 0.65 mg/dL (ref 0.50–1.10)
Chloride: 104 mmol/L (ref 98–110)
GLUCOSE: 84 mg/dL (ref 65–99)
Potassium: 4.3 mmol/L (ref 3.5–5.3)
SODIUM: 138 mmol/L (ref 135–146)
Total Bilirubin: 0.4 mg/dL (ref 0.2–1.2)
Total Protein: 7.5 g/dL (ref 6.1–8.1)

## 2015-11-05 LAB — TSH: TSH: 1.92 mIU/L

## 2015-11-05 LAB — HEMOGLOBIN A1C
Hgb A1c MFr Bld: 5.1 % (ref <5.7)
MEAN PLASMA GLUCOSE: 100 mg/dL

## 2015-11-05 MED ORDER — NORGESTIMATE-ETH ESTRADIOL 0.25-35 MG-MCG PO TABS: 1.0000 | ORAL_TABLET | Freq: Every day | ORAL | 4 refills | Status: DC

## 2015-11-05 NOTE — Patient Instructions (Signed)
Physical in 1 year Pap in 2019  

## 2015-11-05 NOTE — Progress Notes (Signed)
Patient ID: Nichole [name], female   DOB: 12/18/1985, 30 y.o.   MRN: [id number] History of Present Illness: Nichole [name] is a 30 year old white female,married in for well woman gyn exam, she had a normal pap with negative HPV 10/29/14.    Current Medications, Allergies, Past Medical History, Past Surgical History, Family History and Social History were reviewed in [facility] electronic medical record.     Review of Systems: Patient denies any headaches, hearing loss, fatigue, blurred vision, shortness of breath, chest pain, abdominal pain, problems with bowel movements, urination, or intercourse. No joint pain or mood swings.    Physical Exam:BP 132/80 (BP Location: Left Arm, Patient Position: Sitting, Cuff Size: Normal)   Pulse 74   Ht 5\' 3"  (1.6 m)   Wt 189 lb 8 oz (86 kg)   LMP 10/31/2015 (Exact Date)   BMI 33.57 kg/m Waist 34 inches.  General:  Well developed, well nourished, no acute distress Skin:  Warm and dry Neck:  Midline trachea, normal thyroid, good ROM, no lymphadenopathy Lungs; Clear to auscultation bilaterally Breast:  No dominant palpable mass, retraction, or nipple discharge Cardiovascular: Regular rate and rhythm Abdomen:  Soft, non tender, no hepatosplenomegaly Pelvic:  External genitalia is normal in appearance, no lesions.  The vagina is normal in appearance. Urethra has no lesions or masses. The cervix is bulbous.  Uterus is felt to be normal size, shape, and contour.  No adnexal masses or tenderness noted.Bladder is non tender, no masses felt. Extremities/musculoskeletal:  No swelling or varicosities noted, no clubbing or cyanosis Psych:  No mood changes, alert and cooperative,seems happy PHQ 2 score 0.  Impression:  1. Well female exam with routine gynecological exam   2. Encounter for surveillance of contraceptive pills      Plan: Check CBC,CMP,TSH and lipids,A1c and vitamin D Refilled sprintec, disp 3 packs take 1 daily with 4 refills Physical in 1  year,pap in 2019

## 2015-11-06 LAB — VITAMIN D 25 HYDROXY (VIT D DEFICIENCY, FRACTURES): VIT D 25 HYDROXY: 21 ng/mL — AB (ref 30–100)

## 2015-11-09 ENCOUNTER — Telehealth: Payer: Self-pay | Admitting: Adult Health

## 2015-11-09 NOTE — Telephone Encounter (Signed)
Pt aware of labs, decrease carbs and take 5000 IU vitamin D daily

## 2015-12-26 ENCOUNTER — Other Ambulatory Visit: Payer: Self-pay | Admitting: Adult Health

## 2016-03-10 ENCOUNTER — Emergency Department (HOSPITAL_COMMUNITY)
Admission: EM | Admit: 2016-03-10 | Discharge: 2016-03-10 | Disposition: A | Discharge status: Home / Self Care | Payer: Managed Care, Other (non HMO) | Attending: Emergency Medicine | Admitting: Emergency Medicine

## 2016-03-10 ENCOUNTER — Encounter (HOSPITAL_COMMUNITY): Payer: Self-pay | Admitting: *Deleted

## 2016-03-10 ENCOUNTER — Emergency Department (HOSPITAL_COMMUNITY): Payer: Managed Care, Other (non HMO)

## 2016-03-10 DIAGNOSIS — Z87891 Personal history of nicotine dependence: Secondary | ICD-10-CM | POA: Insufficient documentation

## 2016-03-10 DIAGNOSIS — N132 Hydronephrosis with renal and ureteral calculous obstruction: Secondary | ICD-10-CM | POA: Diagnosis not present

## 2016-03-10 DIAGNOSIS — R109 Unspecified abdominal pain: Secondary | ICD-10-CM | POA: Diagnosis present

## 2016-03-10 DIAGNOSIS — N200 Calculus of kidney: Secondary | ICD-10-CM

## 2016-03-10 LAB — URINALYSIS, ROUTINE W REFLEX MICROSCOPIC
Bilirubin Urine: NEGATIVE
Glucose, UA: NEGATIVE mg/dL
KETONES UR: NEGATIVE mg/dL
NITRITE: NEGATIVE
PH: 5.5 (ref 5.0–8.0)
PROTEIN: 100 mg/dL — AB
Specific Gravity, Urine: >1.03 — ABNORMAL HIGH (ref 1.005–1.030)

## 2016-03-10 LAB — COMPREHENSIVE METABOLIC PANEL
ALT: 13 U/L — ABNORMAL LOW (ref 14–54)
AST: 17 U/L (ref 15–41)
Albumin: 3.8 g/dL (ref 3.5–5.0)
Alkaline Phosphatase: 72 U/L (ref 38–126)
Anion gap: 9 (ref 5–15)
BILIRUBIN TOTAL: 0.3 mg/dL (ref 0.3–1.2)
BUN: 13 mg/dL (ref 6–20)
CO2: 22 mmol/L (ref 22–32)
Calcium: 9 mg/dL (ref 8.9–10.3)
Chloride: 107 mmol/L (ref 101–111)
Creatinine, Ser: 0.74 mg/dL (ref 0.44–1.00)
Glucose, Bld: 139 mg/dL — ABNORMAL HIGH (ref 65–99)
POTASSIUM: 3.4 mmol/L — AB (ref 3.5–5.1)
Sodium: 138 mmol/L (ref 135–145)
TOTAL PROTEIN: 7 g/dL (ref 6.5–8.1)

## 2016-03-10 LAB — CBC
HEMATOCRIT: 39.5 % (ref 36.0–46.0)
Hemoglobin: 13 g/dL (ref 12.0–15.0)
MCH: 27 pg (ref 26.0–34.0)
MCHC: 32.9 g/dL (ref 30.0–36.0)
MCV: 82.1 fL (ref 78.0–100.0)
Platelets: 304 10*3/uL (ref 150–400)
RBC: 4.81 MIL/uL (ref 3.87–5.11)
RDW: 13.6 % (ref 11.5–15.5)
WBC: 13.4 10*3/uL — AB (ref 4.0–10.5)

## 2016-03-10 LAB — I-STAT BETA HCG BLOOD, ED (MC, WL, AP ONLY): I-stat hCG, quantitative: <5 m[IU]/mL (ref <5)

## 2016-03-10 LAB — URINALYSIS, MICROSCOPIC (REFLEX)

## 2016-03-10 MED ORDER — ONDANSETRON HCL 4 MG PO TABS: 4.0000 mg | ORAL_TABLET | Freq: Three times a day (TID) | ORAL | 0 refills | Status: DC | PRN

## 2016-03-10 MED ORDER — OXYCODONE-ACETAMINOPHEN 5-325 MG PO TABS: 1.0000 | ORAL_TABLET | ORAL | 0 refills | Status: DC | PRN

## 2016-03-10 MED ORDER — ONDANSETRON HCL 4 MG/2ML IJ SOLN
4.0000 mg | Freq: Once | INTRAMUSCULAR | Status: AC
  Administered 2016-03-10: 4 mg via INTRAVENOUS
  Filled 2016-03-10: qty 2

## 2016-03-10 MED ORDER — HYDROMORPHONE HCL 2 MG/ML IJ SOLN
1.0000 mg | Freq: Once | INTRAMUSCULAR | Status: AC
  Administered 2016-03-10: 1 mg via INTRAVENOUS
  Filled 2016-03-10: qty 1

## 2016-03-10 MED ORDER — IBUPROFEN 800 MG PO TABS: 800.0000 mg | ORAL_TABLET | Freq: Three times a day (TID) | ORAL | 0 refills | Status: DC

## 2016-03-10 MED ORDER — KETOROLAC TROMETHAMINE 30 MG/ML IJ SOLN
30.0000 mg | Freq: Once | INTRAMUSCULAR | Status: AC
  Administered 2016-03-10: 30 mg via INTRAVENOUS
  Filled 2016-03-10: qty 1

## 2016-03-10 MED ORDER — SODIUM CHLORIDE 0.9 % IV BOLUS (SEPSIS)
1000.0000 mL | Freq: Once | INTRAVENOUS | Status: AC
  Administered 2016-03-10: 1000 mL via INTRAVENOUS

## 2016-03-10 NOTE — ED Triage Notes (Signed)
Pt woke up this morning with sharp abdominal pain radiating into back and lower abd. Denies hx of kidney stones. Endorses nausea

## 2016-03-10 NOTE — ED Provider Notes (Signed)
[facility]-EMER[facility]ENCY DEPT Provider Note   CSN: [id number] Arrival date & time: 03/10/16  0506     History   Chief Complaint Chief Complaint  Patient presents with  . Abdominal Pain  . Flank Pain  . Back Pain    HPI Nichole [name] is a 31 y.o. female.  Patient awoke from sleep with R sided abdominal pain and back pain.  Pain constant but waxes and wanes in severity.  Associated with nausea and vomiting and urinary frequency.  No fever.  No dysuria or hematuria.  LMP 2 weeks ago.  Has not tried taking anything for pain.  No CP or SOB.  No history of kidney stones. No abnormal vaginal bleeding or discharge.    The history is provided by the patient.  Abdominal Pain   Associated symptoms include nausea, vomiting and frequency. Pertinent negatives include fever, dysuria, hematuria and headaches.  Flank Pain  Associated symptoms include abdominal pain. Pertinent negatives include no chest pain, no headaches and no shortness of breath.  Back Pain   Associated symptoms include abdominal pain. Pertinent negatives include no chest pain, no fever, no headaches, no dysuria and no weakness.    Past Medical History:  Diagnosis Date  . Hyperlipidemia   . No pertinent past medical history     Patient Active Problem List   Diagnosis Date Noted  . Well female exam with routine gynecological exam 11/05/2015  . Irregular bleeding 11/27/2012  . Contraceptive management 03/28/2012    Past Surgical History:  Procedure Laterality Date  . CERVICAL CONIZATION W/BX  2008    OB History    Gravida Para Term Preterm AB Living   4 3 3  0 1 3   SAB TAB Ectopic Multiple Live Births   1 0 0 0 3       Home Medications    Prior to Admission medications   Medication Sig Start Date End Date Taking? Authorizing Provider  ibuprofen (ADVIL,MOTRIN) 200 MG tablet Take 200-400 mg by mouth every 6 (six) hours as needed for moderate pain.    Yes Historical Provider, MD  Multiple Vitamin (MULTIVITAMIN  WITH MINERALS) TABS tablet Take 1 tablet by mouth once a week.   Yes Historical Provider, MD  SPRINTEC 28 0.25-35 MG-MCG tablet TAKE 1 TABLET BY MOUTH DAILY. 12/28/15  Yes Adline Potter, NP    Family History Family History  Problem Relation Age of Onset  . Diabetes Maternal Grandmother   . Diabetes Maternal Grandfather   . Congestive Heart Failure Maternal Grandfather   . Heart disease Paternal Grandfather     Social History Social History  Substance Use Topics  . Smoking status: Former Smoker    Packs/day: 0.25    Years: 1.00    Types: Cigarettes  . Smokeless tobacco: Never Used  . Alcohol use No     Allergies   Patient has no known allergies.   Review of Systems Review of Systems  Constitutional: Negative for activity change, appetite change and fever.  HENT: Negative for congestion and rhinorrhea.   Respiratory: Negative for cough, chest tightness and shortness of breath.   Cardiovascular: Negative for chest pain.  Gastrointestinal: Positive for abdominal pain, nausea and vomiting.  Genitourinary: Positive for flank pain, frequency and urgency. Negative for dysuria, hematuria, vaginal bleeding and vaginal discharge.  Musculoskeletal: Positive for back pain. Negative for neck pain.  Skin: Negative for rash.  Neurological: Negative for dizziness, weakness, light-headedness and headaches.   A complete 10 system review of  systems was obtained and all systems are negative except as noted in the HPI and PMH.    Physical Exam Updated Vital Signs BP 139/98 (BP Location: Right Arm)   Pulse 82   Temp 97.9 F (36.6 C) (Oral)   Resp 16   LMP 02/29/2016   SpO2 100%   Physical Exam  Constitutional: She is oriented to person, place, and time. She appears well-developed and well-nourished. She appears distressed.  uncomfortable  HENT:  Head: Normocephalic and atraumatic.  Mouth/Throat: Oropharynx is clear and moist. No oropharyngeal exudate.  Eyes: Conjunctivae and  EOM are normal. Pupils are equal, round, and reactive to light.  Neck: Normal range of motion. Neck supple.  No meningismus.  Cardiovascular: Normal rate, regular rhythm, normal heart sounds and intact distal pulses.   No murmur heard. Pulmonary/Chest: Effort normal and breath sounds normal. No respiratory distress.  Abdominal: Soft. There is tenderness. There is no rebound and no guarding.  TTP R abdomen without guarding or rebound  Musculoskeletal: Normal range of motion. She exhibits tenderness. She exhibits no edema.  R CVAT  Neurological: She is alert and oriented to person, place, and time. No cranial nerve deficit. She exhibits normal muscle tone. Coordination normal.  No ataxia on finger to nose bilaterally. No pronator drift. 5/5 strength throughout. CN 2-12 intact.Equal grip strength. Sensation intact.   Skin: Skin is warm. Capillary refill takes less than 2 seconds.  Psychiatric: She has a normal mood and affect. Her behavior is normal.  Nursing note and vitals reviewed.    ED Treatments / Results  Labs (all labs ordered are listed, but only abnormal results are displayed) Labs Reviewed  COMPREHENSIVE METABOLIC PANEL - Abnormal; Notable for the following:       Result Value   Potassium 3.4 (*)    Glucose, Bld 139 (*)    ALT 13 (*)    All other components within normal limits  CBC - Abnormal; Notable for the following:    WBC 13.4 (*)    All other components within normal limits  URINALYSIS, ROUTINE W REFLEX MICROSCOPIC - Abnormal; Notable for the following:    APPearance TURBID (*)    Specific Gravity, Urine >1.030 (*)    Hgb urine dipstick MODERATE (*)    Protein, ur 100 (*)    Leukocytes, UA TRACE (*)    All other components within normal limits  URINALYSIS, MICROSCOPIC (REFLEX) - Abnormal; Notable for the following:    Bacteria, UA FEW (*)    Squamous Epithelial / LPF 0-5 (*)    All other components within normal limits  I-STAT BETA HCG BLOOD, ED ([facility], [facility], [facility]  ONLY)    EKG  EKG Interpretation None       Radiology Ct Renal Stone Study  Result Date: 03/10/2016 CLINICAL DATA:  Acute onset abdominal and low back pain this morning. Nausea. EXAM: CT ABDOMEN AND PELVIS WITHOUT CONTRAST TECHNIQUE: Multidetector CT imaging of the abdomen and pelvis was performed following the standard protocol without IV contrast. COMPARISON:  None. FINDINGS: Lower chest: Lung bases are clear. No pleural or pericardial effusion. Hepatobiliary: The gallbladder is decompressed. The liver appears normal. Pancreas: Unremarkable. No pancreatic ductal dilatation or surrounding inflammatory changes. Spleen: Normal in size without focal abnormality. Adrenals/Urinary Tract: The adrenal glands are normal. The patient has mild to moderate right hydronephrosis due to a 0.3 cm stone at the right UVJ. Three punctate nonobstructing right renal stones are identified. Two punctate nonobstructing stones are seen in the left  kidney. Stomach/Bowel: Stomach is within normal limits. Appendix appears normal. No evidence of bowel wall thickening, distention, or inflammatory changes. Vascular/Lymphatic: No significant vascular findings are present. No enlarged abdominal or pelvic lymph nodes. Reproductive: Uterus and bilateral adnexa are unremarkable. Other: Fat containing umbilical hernia is seen. No lymphadenopathy or fluid. Musculoskeletal: Negative. IMPRESSION: Mild to moderate right hydronephrosis due to a 0.3 cm stone at the right UVJ. 3 punctate nonobstructing right renal stones in 2 punctate nonobstructing left renal stones are also seen. Fat containing umbilical hernia. Electronically Signed   By: Drusilla Kannerhomas  Dalessio M.D.   On: 03/10/2016 08:03    Procedures Procedures (including critical care time)  Medications Ordered in ED Medications  HYDROmorphone (DILAUDID) injection 1 mg (not administered)  ondansetron (ZOFRAN) injection 4 mg (not administered)  sodium chloride 0.9 % bolus 1,000 mL (not  administered)     Initial Impression / Assessment and Plan / ED Course  I have reviewed the triage vital signs and the nursing notes.  Pertinent labs & imaging results that were available during my care of the patient were reviewed by me and considered in my medical decision making (see chart for details).     FLank and side pain with nausea and vomiting. Suspicious for kidney stone,.   UA without infection. Hematuria. Pain and nausea improved with treatment.   CT with 3 mm R UVJ stone.  No UTI.  Pain and nausea controlled. Tolerating PO.  Stable for outpatient follow up with urology. Return precautions discussed.  Final Clinical Impressions(s) / ED Diagnoses   Final diagnoses:  Kidney stone    New Prescriptions New Prescriptions   No medications on file     Glynn OctaveStephen Kea Callan, MD 03/10/16 [phone]

## 2016-03-10 NOTE — Discharge Instructions (Signed)
Take the pain medication as prescribed and follow up with the urologist. Return to the ED if you develop new or worsening symptoms.

## 2016-03-10 NOTE — ED Notes (Signed)
Patient transported to CT 

## 2016-11-07 ENCOUNTER — Encounter: Payer: Self-pay | Admitting: Adult Health

## 2016-11-07 ENCOUNTER — Ambulatory Visit (INDEPENDENT_AMBULATORY_CARE_PROVIDER_SITE_OTHER): Payer: 59 | Admitting: Adult Health

## 2016-11-07 VITALS — BP 130/90 | HR 70 | Ht 63.75 in | Wt 192.0 lb

## 2016-11-07 DIAGNOSIS — Z01419 Encounter for gynecological examination (general) (routine) without abnormal findings: Secondary | ICD-10-CM

## 2016-11-07 DIAGNOSIS — Z3041 Encounter for surveillance of contraceptive pills: Secondary | ICD-10-CM | POA: Diagnosis not present

## 2016-11-07 DIAGNOSIS — Z131 Encounter for screening for diabetes mellitus: Secondary | ICD-10-CM | POA: Diagnosis not present

## 2016-11-07 MED ORDER — NORGESTIMATE-ETH ESTRADIOL 0.25-35 MG-MCG PO TABS: 1.0000 | ORAL_TABLET | Freq: Every day | ORAL | 4 refills | Status: DC

## 2016-11-07 NOTE — Patient Instructions (Signed)
Pap and physical in 1 year  

## 2016-11-07 NOTE — Progress Notes (Signed)
Patient ID: Nichole [name], female   DOB: 01/27/1985, 31 y.o.   MRN: [id number] History of Present Illness:  Nichole [name] is a 31 year old white female, married, G4P3 in for a well woman gyn exam,she had a normal pap with negative HPV 10/29/14.  She still works at [employer]. She needs labs for work.  PCP is Dr Sherwood GamblerFusco.   Current Medications, Allergies, Past Medical History, Past Surgical History, Family History and Social History were reviewed in [facility] electronic medical record.     Review of Systems: Patient denies any headaches, hearing loss, fatigue, blurred vision, shortness of breath, chest pain, abdominal pain, problems with bowel movements, urination, or intercourse. No joint pain or mood swings.    Physical Exam:BP 130/90 (BP Location: Left Arm, Patient Position: Sitting, Cuff Size: Normal)   Pulse 70   Ht 5' 3.75" (1.619 m)   Wt 192 lb (87.1 kg)   LMP 11/04/2016   BMI 33.22 kg/m waist is 35.5 inches.  General:  Well developed, well nourished, no acute distress Skin:  Warm and dry Neck:  Midline trachea, normal thyroid, good ROM, no lymphadenopathy Lungs; Clear to auscultation bilaterally Breast:  No dominant palpable mass, retraction, or nipple discharge Cardiovascular: Regular rate and rhythm Abdomen:  Soft, non tender, no hepatosplenomegaly Pelvic:  External genitalia is normal in appearance, no lesions.  The vagina is normal in appearance. Urethra has no lesions or masses. The cervix is bulbous.  Uterus is felt to be normal size, shape, and contour.  No adnexal masses or tenderness noted.Bladder is non tender, no masses felt. Extremities/musculoskeletal:  No swelling or varicosities noted, no clubbing or cyanosis Psych:  No mood changes, alert and cooperative,seems happy PHQ 2 score 0.  Impression: 1. Well female exam with routine gynecological exam   2. Encounter for surveillance of contraceptive pills   3. Screening for diabetes mellitus        Plan:  Check CBC,CMP,TSH and lipids,A1c, in am fasting.  Pap and physical in 1 year Meds ordered this encounter  Medications  . norgestimate-ethinyl estradiol (SPRINTEC 28) 0.25-35 MG-MCG tablet    Sig: Take 1 tablet by mouth daily.    Dispense:  84 tablet    Refill:  4    Order Specific Question:   Supervising Provider    Answer:   Duane LopeEURE, LUTHER H [[id number]]

## 2016-11-08 ENCOUNTER — Other Ambulatory Visit: Payer: Self-pay | Admitting: Adult Health

## 2016-11-09 ENCOUNTER — Telehealth: Payer: Self-pay | Admitting: Adult Health

## 2016-11-09 LAB — CBC
HEMATOCRIT: 39.3 % (ref 35.0–45.0)
Hemoglobin: 13.1 g/dL (ref 11.7–15.5)
MCH: 26.8 pg — ABNORMAL LOW (ref 27.0–33.0)
MCHC: 33.3 g/dL (ref 32.0–36.0)
MCV: 80.5 fL (ref 80.0–100.0)
MPV: 10.5 fL (ref 7.5–12.5)
Platelets: 290 10*3/uL (ref 140–400)
RBC: 4.88 10*6/uL (ref 3.80–5.10)
RDW: 12.3 % (ref 11.0–15.0)
WBC: 9.4 10*3/uL (ref 3.8–10.8)

## 2016-11-09 LAB — LIPID PANEL
Cholesterol: 177 mg/dL (ref <200)
HDL: 44 mg/dL — AB (ref >50)
LDL CHOLESTEROL (CALC): 105 mg/dL — AB
Non-HDL Cholesterol (Calc): 133 mg/dL (calc) — ABNORMAL HIGH (ref <130)
TRIGLYCERIDES: 160 mg/dL — AB (ref <150)
Total CHOL/HDL Ratio: 4 (calc) (ref <5.0)

## 2016-11-09 LAB — COMPLETE METABOLIC PANEL WITH GFR
AG RATIO: 1.6 (calc) (ref 1.0–2.5)
ALBUMIN MSPROF: 4.2 g/dL (ref 3.6–5.1)
ALT: 9 U/L (ref 6–29)
AST: 11 U/L (ref 10–30)
Alkaline phosphatase (APISO): 86 U/L (ref 33–115)
BUN: 12 mg/dL (ref 7–25)
CALCIUM: 9 mg/dL (ref 8.6–10.2)
CO2: 24 mmol/L (ref 20–32)
Chloride: 105 mmol/L (ref 98–110)
Creat: 0.58 mg/dL (ref 0.50–1.10)
GFR, EST AFRICAN AMERICAN: 142 mL/min/{1.73_m2} (ref >=60)
GFR, EST NON AFRICAN AMERICAN: 123 mL/min/{1.73_m2} (ref >=60)
Globulin: 2.7 g/dL (calc) (ref 1.9–3.7)
Glucose, Bld: 97 mg/dL (ref 65–99)
POTASSIUM: 4.1 mmol/L (ref 3.5–5.3)
Sodium: 138 mmol/L (ref 135–146)
TOTAL PROTEIN: 6.9 g/dL (ref 6.1–8.1)
Total Bilirubin: 0.4 mg/dL (ref 0.2–1.2)

## 2016-11-09 LAB — TSH: TSH: 1.67 mIU/L

## 2016-11-09 LAB — HEMOGLOBIN A1C
Hgb A1c MFr Bld: 5.2 % of total Hgb (ref <5.7)
Mean Plasma Glucose: 103 (calc)
eAG (mmol/L): 5.7 (calc)

## 2016-11-09 NOTE — Telephone Encounter (Signed)
left message about labs and that form faxed

## 2017-08-22 ENCOUNTER — Other Ambulatory Visit (HOSPITAL_COMMUNITY)
Admission: RE | Admit: 2017-08-22 | Discharge: 2017-08-22 | Disposition: A | Discharge status: Home / Self Care | Payer: 59 | Source: Ambulatory Visit | Attending: Adult Health | Admitting: Adult Health

## 2017-08-22 ENCOUNTER — Ambulatory Visit (INDEPENDENT_AMBULATORY_CARE_PROVIDER_SITE_OTHER): Payer: 59 | Admitting: Adult Health

## 2017-08-22 ENCOUNTER — Encounter: Payer: Self-pay | Admitting: Adult Health

## 2017-08-22 VITALS — BP 133/86 | HR 61 | Ht 64.0 in | Wt 193.0 lb

## 2017-08-22 DIAGNOSIS — Z131 Encounter for screening for diabetes mellitus: Secondary | ICD-10-CM

## 2017-08-22 DIAGNOSIS — Z309 Encounter for contraceptive management, unspecified: Principal | ICD-10-CM

## 2017-08-22 DIAGNOSIS — Z01419 Encounter for gynecological examination (general) (routine) without abnormal findings: Secondary | ICD-10-CM | POA: Insufficient documentation

## 2017-08-22 DIAGNOSIS — Z3041 Encounter for surveillance of contraceptive pills: Secondary | ICD-10-CM

## 2017-08-22 MED ORDER — NORGESTIMATE-ETH ESTRADIOL 0.25-35 MG-MCG PO TABS
1.0000 | ORAL_TABLET | Freq: Every day | ORAL | 4 refills | Status: DC
Start: 2017-08-22 — End: 2018-10-05

## 2017-08-22 NOTE — Progress Notes (Signed)
Patient ID: Nichole [name] A [name], female   DOB: 08/09/1985, 32 y.o.   MRN: [id number] History of Present Illness: Nichole [name] is a 32 year old white female,married,G4P3 in for a well woman gyn exam and pap. PCP is Dr Sherwood GamblerFusco.    Current Medications, Allergies, Past Medical History, Past Surgical History, Family History and Social History were reviewed in [facility] electronic medical record.     Review of Systems: Patient denies any headaches, hearing loss, fatigue, blurred vision, shortness of breath, chest pain, abdominal pain, problems with bowel movements, urination, or intercourse. No joint pain or mood swings. Happy with OCs.    Physical Exam:BP 133/86 (BP Location: Left Arm, Patient Position: Sitting, Cuff Size: Normal)   Pulse 61   Ht 5\' 4"  (1.626 m)   Wt 193 lb (87.5 kg)   LMP 08/06/2017   BMI 33.13 kg/m Waist circumference was 35 1/2 inches. General:  Well developed, well nourished, no acute distress Skin:  Warm and dry Neck:  Midline trachea, normal thyroid, good ROM, no lymphadenopathy Lungs; Clear to auscultation bilaterally Breast:  No dominant palpable mass, retraction, or nipple discharge Cardiovascular: Regular rate and rhythm Abdomen:  Soft, non tender, no hepatosplenomegaly Pelvic:  External genitalia is normal in appearance, no lesions.  The vagina is normal in appearance. Urethra has no lesions or masses. The cervix is bulbous.Pap with HPV performed.  Uterus is felt to be normal size, shape, and contour.  No adnexal masses or tenderness noted.Bladder is non tender, no masses felt. Extremities/musculoskeletal:  No swelling or varicosities noted, no clubbing or cyanosis Psych:  No mood changes, alert and cooperative,seems happy PHQ 9 score 0.She requests labs for work.  Impression:    ICD-10-CM   1. Encounter for physical examination, contraception, and Papanicolaou smear of cervix Z01.419 CBC   Z30.9 Comprehensive metabolic panel   G95.6Z12.4 TSH    Lipid panel   Hemoglobin A1c  2. Encounter for surveillance of contraceptive pills Z30.41   3. Screening for diabetes mellitus Z13.1 Hemoglobin A1c     Plan: Check CBC,CMP,TSH and lipids,A1c  Meds ordered this encounter  Medications  . norgestimate-ethinyl estradiol (SPRINTEC 28) 0.25-35 MG-MCG tablet    Sig: Take 1 tablet by mouth daily.    Dispense:  84 tablet    Refill:  4    Order Specific Question:   Supervising Provider    Answer:   Lazaro ArmsEURE, LUTHER H [[id number]]  Physical in 1 year Pap in 3 if normal

## 2017-08-22 NOTE — Addendum Note (Signed)
Addended by: Federico FlakeNES, Anushree Dorsi A on: 08/22/2017 10:14 AM   Modules accepted: Orders

## 2017-08-24 ENCOUNTER — Telehealth: Payer: Self-pay | Admitting: Adult Health

## 2017-08-24 LAB — TSH: TSH: 1.74 mIU/L

## 2017-08-24 LAB — CYTOLOGY - PAP
Diagnosis: NEGATIVE
HPV: NOT DETECTED

## 2017-08-24 LAB — CBC
HEMATOCRIT: 39.2 % (ref 35.0–45.0)
HEMOGLOBIN: 13.2 g/dL (ref 11.7–15.5)
MCH: 27.3 pg (ref 27.0–33.0)
MCHC: 33.7 g/dL (ref 32.0–36.0)
MCV: 81.2 fL (ref 80.0–100.0)
MPV: 10.6 fL (ref 7.5–12.5)
Platelets: 307 10*3/uL (ref 140–400)
RBC: 4.83 10*6/uL (ref 3.80–5.10)
RDW: 12.7 % (ref 11.0–15.0)
WBC: 8.9 10*3/uL (ref 3.8–10.8)

## 2017-08-24 LAB — COMPREHENSIVE METABOLIC PANEL
AG RATIO: 1.6 (calc) (ref 1.0–2.5)
ALBUMIN MSPROF: 4.2 g/dL (ref 3.6–5.1)
ALT: 10 U/L (ref 6–29)
AST: 11 U/L (ref 10–30)
Alkaline phosphatase (APISO): 85 U/L (ref 33–115)
BILIRUBIN TOTAL: 0.4 mg/dL (ref 0.2–1.2)
BUN: 11 mg/dL (ref 7–25)
CHLORIDE: 105 mmol/L (ref 98–110)
CO2: 24 mmol/L (ref 20–32)
Calcium: 9 mg/dL (ref 8.6–10.2)
Creat: 0.63 mg/dL (ref 0.50–1.10)
GLOBULIN: 2.7 g/dL (ref 1.9–3.7)
Glucose, Bld: 94 mg/dL (ref 65–99)
POTASSIUM: 4 mmol/L (ref 3.5–5.3)
SODIUM: 139 mmol/L (ref 135–146)
TOTAL PROTEIN: 6.9 g/dL (ref 6.1–8.1)

## 2017-08-24 LAB — LIPID PANEL
Cholesterol: 194 mg/dL (ref <200)
HDL: 43 mg/dL — ABNORMAL LOW (ref >50)
LDL Cholesterol (Calc): 121 mg/dL (calc) — ABNORMAL HIGH
NON-HDL CHOLESTEROL (CALC): 151 mg/dL — AB (ref <130)
TRIGLYCERIDES: 180 mg/dL — AB (ref <150)
Total CHOL/HDL Ratio: 4.5 (calc) (ref <5.0)

## 2017-08-24 LAB — HEMOGLOBIN A1C
HEMOGLOBIN A1C: 5.3 %{Hb} (ref <5.7)
MEAN PLASMA GLUCOSE: 105 (calc)
eAG (mmol/L): 5.8 (calc)

## 2017-08-24 NOTE — Telephone Encounter (Signed)
Pt aware of labs, need to cut carbs, and fat and increase exercise, she wants to try to lose 20 lbs

## 2018-01-15 ENCOUNTER — Ambulatory Visit: Payer: 59 | Admitting: Podiatry

## 2018-01-26 ENCOUNTER — Encounter (INDEPENDENT_AMBULATORY_CARE_PROVIDER_SITE_OTHER): Payer: Self-pay

## 2018-05-10 ENCOUNTER — Other Ambulatory Visit: Payer: Self-pay | Admitting: Podiatry

## 2018-05-10 ENCOUNTER — Ambulatory Visit (INDEPENDENT_AMBULATORY_CARE_PROVIDER_SITE_OTHER): Payer: 59 | Admitting: Podiatry

## 2018-05-10 ENCOUNTER — Ambulatory Visit (INDEPENDENT_AMBULATORY_CARE_PROVIDER_SITE_OTHER): Payer: 59

## 2018-05-10 ENCOUNTER — Encounter: Payer: Self-pay | Admitting: Podiatry

## 2018-05-10 ENCOUNTER — Other Ambulatory Visit: Payer: Self-pay

## 2018-05-10 VITALS — BP 123/58 | HR 81 | Temp 97.5°F

## 2018-05-10 DIAGNOSIS — M216X2 Other acquired deformities of left foot: Secondary | ICD-10-CM | POA: Diagnosis not present

## 2018-05-10 DIAGNOSIS — M7752 Other enthesopathy of left foot: Secondary | ICD-10-CM | POA: Diagnosis not present

## 2018-05-10 DIAGNOSIS — M7662 Achilles tendinitis, left leg: Secondary | ICD-10-CM | POA: Diagnosis not present

## 2018-05-10 DIAGNOSIS — M722 Plantar fascial fibromatosis: Secondary | ICD-10-CM

## 2018-05-10 DIAGNOSIS — M7732 Calcaneal spur, left foot: Secondary | ICD-10-CM | POA: Diagnosis not present

## 2018-05-10 MED ORDER — MELOXICAM 15 MG PO TABS: 15.0000 mg | ORAL_TABLET | Freq: Every day | ORAL | 0 refills | Status: DC

## 2018-05-10 NOTE — Patient Instructions (Signed)

## 2018-05-10 NOTE — Progress Notes (Signed)
  Subjective:  Patient ID: Nichole [name], female    DOB: [birth year],  MRN: [id number]  Chief Complaint  Patient presents with  . Foot Pain    L-back of heel; "shooting pains that go up leg; worse in the mornings; hurts with pressure; x36yr"   33 y.o. female presents with the above complaint.   Review of Systems: Negative except as noted in the HPI. Denies N/V/F/Ch.  Past Medical History:  Diagnosis Date  . Hyperlipidemia   . Kidney stones   . No pertinent past medical history     Current Outpatient Medications:  .  ibuprofen (ADVIL,MOTRIN) 800 MG tablet, Take 1 tablet (800 mg total) by mouth 3 (three) times daily. (Patient taking differently: Take 800 mg by mouth as needed. ), Disp: 21 tablet, Rfl: 0 .  norgestimate-ethinyl estradiol (SPRINTEC 28) 0.25-35 MG-MCG tablet, Take 1 tablet by mouth daily., Disp: 84 tablet, Rfl: 4 .  meloxicam (MOBIC) 15 MG tablet, Take 1 tablet (15 mg total) by mouth daily., Disp: 30 tablet, Rfl: 0 .  Multiple Vitamin (MULTIVITAMIN WITH MINERALS) TABS tablet, Take 1 tablet by mouth once a week., Disp: , Rfl:   Social History   Tobacco Use  Smoking Status Former Smoker  . Packs/day: 0.25  . Years: 1.00  . Pack years: 0.25  . Types: Cigarettes  Smokeless Tobacco Never Used    No Known Allergies Objective:   Vitals:   05/10/18 1532  BP: (!) 123/58  Pulse: 81  Temp: (!) 97.5 F (36.4 C)   There is no height or weight on file to calculate BMI. Constitutional Well developed. Well nourished.  Vascular Dorsalis pedis pulses palpable bilaterally. Posterior tibial pulses palpable bilaterally. Capillary refill normal to all digits.  No cyanosis or clubbing noted. Pedal hair growth normal.  Neurologic Normal speech. Oriented to person, place, and time. Epicritic sensation to light touch grossly present bilaterally.  Dermatologic Nails well groomed and normal in appearance. No open wounds. No skin lesions.  Orthopedic: Normal joint ROM  without pain or crepitus bilaterally. No visible deformities. Tender to palpation at the posterior calcaneus left. No pain with calcaneal squeeze left. Ankle ROM diminished range with pain left. Silfverskiold Test: positive left.   Radiographs: Taken and reviewed. No acute fractures or dislocations. No evidence of stress fracture.  Plantar heel spur absent. Posterior heel spur present. Os trigonum present  Assessment:   1. Tendonitis, Achilles, left   2. Acquired equinus deformity of left foot   3. Calcaneal spur of left foot   4. Bursitis of heel, left    Plan:  Patient was evaluated and treated and all questions answered.  Achilles Tendonitis, left -XR reviewed as above. -Educated on stretching and icing of the affected limb. -Night splint dispensed. -Referral placed to physical therapy. -eRx for Medrol 6-day taper. Advised on how to take medication.  No follow-ups on file.

## 2018-06-02 ENCOUNTER — Other Ambulatory Visit: Payer: Self-pay | Admitting: Podiatry

## 2018-06-07 ENCOUNTER — Other Ambulatory Visit: Payer: Self-pay

## 2018-06-07 ENCOUNTER — Ambulatory Visit (INDEPENDENT_AMBULATORY_CARE_PROVIDER_SITE_OTHER): Payer: 59 | Admitting: Podiatry

## 2018-06-07 DIAGNOSIS — M7732 Calcaneal spur, left foot: Secondary | ICD-10-CM | POA: Diagnosis not present

## 2018-06-07 DIAGNOSIS — M216X2 Other acquired deformities of left foot: Secondary | ICD-10-CM

## 2018-06-07 DIAGNOSIS — M7752 Other enthesopathy of left foot: Secondary | ICD-10-CM | POA: Diagnosis not present

## 2018-06-07 DIAGNOSIS — M7662 Achilles tendinitis, left leg: Secondary | ICD-10-CM | POA: Diagnosis not present

## 2018-06-07 NOTE — Progress Notes (Signed)
  Subjective:  Patient ID: Nichole [name], female    DOB: 1985-03-08,  MRN: [id number]  No chief complaint on file.  33 y.o. female presents with the above complaint.  States that the pain that I do so has very good days and bad enough to at a time.  Has been stretching admits that she has not stretching states that the pain is over with that she is not having shooting pain up her leg that she previously had.  Completed the Medrol Dosepak and states that that helped as well.  Review of Systems: Negative except as noted in the HPI. Denies N/V/F/Ch.  Past Medical History:  Diagnosis Date  . Hyperlipidemia   . Kidney stones   . No pertinent past medical history     Current Outpatient Medications:  .  ibuprofen (ADVIL,MOTRIN) 800 MG tablet, Take 1 tablet (800 mg total) by mouth 3 (three) times daily. (Patient taking differently: Take 800 mg by mouth as needed. ), Disp: 21 tablet, Rfl: 0 .  meloxicam (MOBIC) 15 MG tablet, TAKE 1 TABLET BY MOUTH EVERY DAY, Disp: 30 tablet, Rfl: 0 .  Multiple Vitamin (MULTIVITAMIN WITH MINERALS) TABS tablet, Take 1 tablet by mouth once a week., Disp: , Rfl:  .  norgestimate-ethinyl estradiol (SPRINTEC 28) 0.25-35 MG-MCG tablet, Take 1 tablet by mouth daily., Disp: 84 tablet, Rfl: 4  Social History   Tobacco Use  Smoking Status Former Smoker  . Packs/day: 0.25  . Years: 1.00  . Pack years: 0.25  . Types: Cigarettes  Smokeless Tobacco Never Used    No Known Allergies Objective:   There were no vitals filed for this visit. There is no height or weight on file to calculate BMI. Constitutional Well developed. Well nourished.  Vascular Dorsalis pedis pulses palpable bilaterally. Posterior tibial pulses palpable bilaterally. Capillary refill normal to all digits.  No cyanosis or clubbing noted. Pedal hair growth normal.  Neurologic Normal speech. Oriented to person, place, and time. Epicritic sensation to light touch grossly present bilaterally.   Dermatologic Nails well groomed and normal in appearance. No open wounds. No skin lesions.  Orthopedic: Continued Equinus left ankle. Mild POP left Achilles tendon.   Radiographs: None  Assessment:   1. Tendonitis, Achilles, left   2. Acquired equinus deformity of left foot   3. Calcaneal spur of left foot   4. Bursitis of heel, left    Plan:  Patient was evaluated and treated and all questions answered.  Achilles Tendonitis, left -Clinically improving discussed continued stretching should pain persist would consider PT or further intervention possible EPAT vs surgical intervention. F/u as needed should pain persist.  No follow-ups on file.

## 2018-10-03 ENCOUNTER — Other Ambulatory Visit: Payer: Self-pay | Admitting: Adult Health

## 2018-10-05 NOTE — Telephone Encounter (Signed)
Refilled til pt appt in Dec.

## 2018-11-14 ENCOUNTER — Other Ambulatory Visit: Payer: Self-pay | Admitting: *Deleted

## 2018-11-14 DIAGNOSIS — Z20822 Contact with and (suspected) exposure to covid-19: Secondary | ICD-10-CM

## 2018-11-15 LAB — NOVEL CORONAVIRUS, NAA: SARS-CoV-2, NAA: NOT DETECTED

## 2018-12-20 ENCOUNTER — Other Ambulatory Visit: Payer: Managed Care, Other (non HMO) | Admitting: Adult Health

## 2018-12-25 ENCOUNTER — Other Ambulatory Visit: Payer: Self-pay | Admitting: Obstetrics and Gynecology

## 2018-12-26 ENCOUNTER — Other Ambulatory Visit: Payer: Self-pay

## 2019-02-04 ENCOUNTER — Encounter: Payer: Self-pay | Admitting: Adult Health

## 2019-02-04 ENCOUNTER — Other Ambulatory Visit: Payer: Self-pay

## 2019-02-04 ENCOUNTER — Ambulatory Visit (INDEPENDENT_AMBULATORY_CARE_PROVIDER_SITE_OTHER): Payer: No Typology Code available for payment source | Admitting: Adult Health

## 2019-02-04 VITALS — BP 120/75 | HR 73 | Ht 64.0 in | Wt 195.4 lb

## 2019-02-04 DIAGNOSIS — Z3041 Encounter for surveillance of contraceptive pills: Secondary | ICD-10-CM

## 2019-02-04 DIAGNOSIS — Z01419 Encounter for gynecological examination (general) (routine) without abnormal findings: Secondary | ICD-10-CM | POA: Diagnosis not present

## 2019-02-04 MED ORDER — NORGESTIMATE-ETH ESTRADIOL 0.25-35 MG-MCG PO TABS: 1.0000 | ORAL_TABLET | Freq: Every day | ORAL | 4 refills | Status: DC

## 2019-02-04 NOTE — Progress Notes (Signed)
Patient ID: Nichole [name], female   DOB: 12-29-1985, 34 y.o.   MRN: [id number] History of Present Illness: Nichole [name] is a 34 year old white female, married, [id number], in for a gyn exam, she had normal pap with negative HPV 08/22/17. PCP is D rFusco.   Current Medications, Allergies, Past Medical History, Past Surgical History, Family History and Social History were reviewed in [employer] record.     Review of Systems:  Patient denies any headaches, hearing loss, fatigue, blurred vision, shortness of breath, chest pain, abdominal pain, problems with bowel movements, urination, or intercourse. No joint pain or mood swings. She is happy with her OCs.   Physical Exam:BP 120/75 (BP Location: Left Arm, Patient Position: Sitting, Cuff Size: Normal)   Pulse 73   Ht 5\' 4"  (1.626 m)   Wt 195 lb 6.4 oz (88.6 kg)   LMP 01/24/2019 (Exact Date)   BMI 33.54 kg/m  General:  Well developed, well nourished, no acute distress Skin:  Warm and dry Neck:  Midline trachea, normal thyroid, good ROM, no lymphadenopathy Lungs; Clear to auscultation bilaterally Breast:  No dominant palpable mass, retraction, or nipple discharge Cardiovascular: Regular rate and rhythm Abdomen:  Soft, non tender, no hepatosplenomegaly Pelvic:  External genitalia is normal in appearance, no lesions.  The vagina is normal in appearance. Urethra has no lesions or masses. The cervix is bulbous.  Uterus is felt to be normal size, shape, and contour.  No adnexal masses or tenderness noted.Bladder is non tender, no masses felt. Extremities/musculoskeletal:  No swelling or varicosities noted, no clubbing or cyanosis Psych:  No mood changes, alert and cooperative,seems happy Fall risk is low PHQ 2 score is 0 Examination chaperoned by 01/26/2019 LPN.    Impression and Plan: 1. Encounter for surveillance of contraceptive pills Will refill OCs Meds ordered this encounter  Medications  . norgestimate-ethinyl  estradiol (SPRINTEC 28) 0.25-35 MG-MCG tablet    Sig: Take 1 tablet by mouth daily.    Dispense:  84 tablet    Refill:  4    Order Specific Question:   Supervising Provider    Answer:   09-26-1998, LUTHER H [[id number]]    2. Encounter for well woman exam with routine gynecological exam Pap and physical in 1 year Labs next year  Mammogram at 4

## 2019-08-22 ENCOUNTER — Other Ambulatory Visit: Payer: No Typology Code available for payment source | Admitting: Adult Health

## 2019-09-19 ENCOUNTER — Other Ambulatory Visit: Payer: No Typology Code available for payment source

## 2019-09-19 ENCOUNTER — Other Ambulatory Visit: Payer: Self-pay

## 2019-09-19 DIAGNOSIS — Z20822 Contact with and (suspected) exposure to covid-19: Secondary | ICD-10-CM

## 2019-09-21 LAB — NOVEL CORONAVIRUS, NAA: SARS-CoV-2, NAA: NOT DETECTED

## 2019-09-21 LAB — SARS-COV-2, NAA 2 DAY TAT

## 2019-09-24 ENCOUNTER — Other Ambulatory Visit: Payer: Self-pay

## 2019-09-24 ENCOUNTER — Ambulatory Visit (INDEPENDENT_AMBULATORY_CARE_PROVIDER_SITE_OTHER): Payer: No Typology Code available for payment source | Admitting: Adult Health

## 2019-09-24 ENCOUNTER — Other Ambulatory Visit (HOSPITAL_COMMUNITY)
Admission: RE | Admit: 2019-09-24 | Discharge: 2019-09-24 | Disposition: A | Discharge status: Home / Self Care | Payer: No Typology Code available for payment source | Source: Ambulatory Visit | Attending: Adult Health | Admitting: Adult Health

## 2019-09-24 ENCOUNTER — Encounter: Payer: Self-pay | Admitting: Adult Health

## 2019-09-24 VITALS — BP 127/81 | HR 65 | Ht 64.0 in | Wt 193.0 lb

## 2019-09-24 DIAGNOSIS — Z01419 Encounter for gynecological examination (general) (routine) without abnormal findings: Secondary | ICD-10-CM | POA: Insufficient documentation

## 2019-09-24 DIAGNOSIS — Z3041 Encounter for surveillance of contraceptive pills: Secondary | ICD-10-CM

## 2019-09-24 MED ORDER — NORGESTIMATE-ETH ESTRADIOL 0.25-35 MG-MCG PO TABS: 1.0000 | ORAL_TABLET | Freq: Every day | ORAL | 4 refills | Status: DC

## 2019-09-24 NOTE — Progress Notes (Signed)
Patient ID: Nichole [name], female   DOB: 13-Sep-1985, 34 y.o.   MRN: [id number] History of Present Illness:  Nichole [name] is a 34 year old white female,married, [id number], in for a well woman gyn exam and pap. She works for [employer]. PCP is Dr Sherwood Gambler.  Current Medications, Allergies, Past Medical History, Past Surgical History, Family History and Social History were reviewed in [employer] [facility] electronic medical record.     Review of Systems: Patient denies any headaches, hearing loss, fatigue, blurred vision, shortness of breath, chest pain, abdominal pain, problems with bowel movements, urination, or intercourse. No joint pain or mood swings. She is happy with her OCs.    Physical Exam:BP 127/81 (BP Location: Left Arm, Patient Position: Sitting, Cuff Size: Normal)   Pulse 65   Ht 5\' 4"  (1.626 m)   Wt 193 lb (87.5 kg)   LMP 08/31/2019 (Exact Date)   BMI 33.13 kg/m  General:  Well developed, well nourished, no acute distress Skin:  Warm and dry Neck:  Midline trachea, normal thyroid, good ROM, no lymphadenopathy Lungs; Clear to auscultation bilaterally Breast:  No dominant palpable mass, retraction, or nipple discharge Cardiovascular: Regular rate and rhythm Abdomen:  Soft, non tender, no hepatosplenomegaly Pelvic:  External genitalia is normal in appearance, no lesions.  The vagina is normal in appearance. Urethra has no lesions or masses. The cervix is bulbous.Pap with high risk HPV genotyping performed.  Uterus is felt to be normal size, shape, and contour.  No adnexal masses or tenderness noted.Bladder is non tender, no masses felt. Extremities/musculoskeletal:  No swelling or varicosities noted, no clubbing or cyanosis Psych:  No mood changes, alert and cooperative,seems happy AA is 0  Fall risk is low PHQ 9 score is 3  [facility] - 09/24/19 0926      Pregnancy Intention Screening   Does the patient want to become pregnant in the next year? No    Does the patient's partner want  to become pregnant in the next year? No    Would the patient like to discuss contraceptive options today? No      Contraception Wrap Up   Current Method Oral Contraceptive    End Method Oral Contraceptive    Contraception Counseling Provided No         Examination chaperoned by 09/26/19 CMA.  Impression and Plan: 1. Encounter for gynecological examination with Papanicolaou smear of cervix Pap sent Physical in 1 year  Pap in 3 if normal Declines labs this year  2. Encounter for surveillance of contraceptive pills Will refill OCs  Meds ordered this encounter  Medications  . norgestimate-ethinyl estradiol (SPRINTEC 28) 0.25-35 MG-MCG tablet    Sig: Take 1 tablet by mouth daily.    Dispense:  84 tablet    Refill:  4    Order Specific Question:   Supervising Provider    Answer:   09-26-1998 H [[id number]]

## 2019-09-26 LAB — CYTOLOGY - PAP
Comment: NEGATIVE
Diagnosis: NEGATIVE
High risk HPV: NEGATIVE

## 2020-01-30 ENCOUNTER — Other Ambulatory Visit: Payer: No Typology Code available for payment source

## 2020-01-31 ENCOUNTER — Other Ambulatory Visit: Payer: No Typology Code available for payment source

## 2020-02-03 ENCOUNTER — Other Ambulatory Visit: Payer: No Typology Code available for payment source

## 2020-02-03 DIAGNOSIS — Z20822 Contact with and (suspected) exposure to covid-19: Secondary | ICD-10-CM

## 2020-02-05 LAB — SARS-COV-2, NAA 2 DAY TAT

## 2020-02-05 LAB — NOVEL CORONAVIRUS, NAA

## 2020-05-18 ENCOUNTER — Telehealth: Payer: Self-pay

## 2020-05-18 NOTE — Telephone Encounter (Signed)
Please advise. Thank you

## 2020-05-18 NOTE — Telephone Encounter (Signed)
Nichole [name] is wanting to know if Dr Lorin Picket will take her on her husband Nichole [name] and both kids are patients of Dr Lorin Picket.  [name] call back [phone]

## 2020-05-19 NOTE — Telephone Encounter (Signed)
We will take her on as a patient of our practice thank you

## 2020-06-02 ENCOUNTER — Other Ambulatory Visit: Payer: Self-pay

## 2020-06-02 ENCOUNTER — Encounter: Payer: Self-pay | Admitting: Family Medicine

## 2020-06-02 ENCOUNTER — Ambulatory Visit (INDEPENDENT_AMBULATORY_CARE_PROVIDER_SITE_OTHER): Payer: No Typology Code available for payment source | Admitting: Family Medicine

## 2020-06-02 VITALS — BP 128/72 | HR 96 | Temp 96.6°F | Wt 187.4 lb

## 2020-06-02 DIAGNOSIS — R1013 Epigastric pain: Secondary | ICD-10-CM | POA: Diagnosis not present

## 2020-06-02 DIAGNOSIS — K8020 Calculus of gallbladder without cholecystitis without obstruction: Secondary | ICD-10-CM

## 2020-06-02 NOTE — Progress Notes (Signed)
   Subjective:    Patient ID: Nichole [name], female    DOB: 04-17-1985, 35 y.o.   MRN: [id number]  HPI Pt having issues with gallbladder. Pt was seeing [facility]. Pt had 2 episodes in 2 weeks which she states is very unusual. Pt will take Tums or Prilosec if needed. Pt will have tenderness, sharp pain through back and chest, nausea.   Patient relates a lot of symptoms that go along with gallbladder disease including abdominal pain discomfort epigastric pain nausea aching occurs after eating she is back in 2014 have gallstones on ultrasound decided not to get her gallbladder removed now she is having increased trouble over the past several weeks Review of Systems     Objective:   Physical Exam Lungs clear heart regular abdomen is soft no guarding rebound or tenderness       Assessment & Plan:  Significant symptomatology along with findings on the 2014 ultrasound of gallstones  Need up-to-date ultrasound Lab work ordered Not emergent Referral to general surgery for removal consideration Warning signs when to go to the ER was discussed Dietary discussed Will help set up with [facility]

## 2020-06-03 ENCOUNTER — Telehealth: Payer: Self-pay | Admitting: Family Medicine

## 2020-06-03 ENCOUNTER — Ambulatory Visit (HOSPITAL_COMMUNITY)
Admission: RE | Admit: 2020-06-03 | Discharge: 2020-06-03 | Disposition: A | Discharge status: Home / Self Care | Payer: No Typology Code available for payment source | Source: Ambulatory Visit | Attending: Family Medicine | Admitting: Family Medicine

## 2020-06-03 DIAGNOSIS — K8020 Calculus of gallbladder without cholecystitis without obstruction: Secondary | ICD-10-CM | POA: Insufficient documentation

## 2020-06-03 DIAGNOSIS — R1013 Epigastric pain: Secondary | ICD-10-CM | POA: Insufficient documentation

## 2020-06-03 LAB — CBC WITH DIFFERENTIAL/PLATELET
Basophils Absolute: 0 10*3/uL (ref 0.0–0.2)
Basos: 0 %
EOS (ABSOLUTE): 0.2 10*3/uL (ref 0.0–0.4)
Eos: 2 %
Hematocrit: 38.8 % (ref 34.0–46.6)
Hemoglobin: 13 g/dL (ref 11.1–15.9)
Immature Grans (Abs): 0 10*3/uL (ref 0.0–0.1)
Immature Granulocytes: 0 %
Lymphocytes Absolute: 3.4 10*3/uL — ABNORMAL HIGH (ref 0.7–3.1)
Lymphs: 36 %
MCH: 28.1 pg (ref 26.6–33.0)
MCHC: 33.5 g/dL (ref 31.5–35.7)
MCV: 84 fL (ref 79–97)
Monocytes Absolute: 0.5 10*3/uL (ref 0.1–0.9)
Monocytes: 6 %
Neutrophils Absolute: 5.1 10*3/uL (ref 1.4–7.0)
Neutrophils: 56 %
Platelets: 327 10*3/uL (ref 150–450)
RBC: 4.63 x10E6/uL (ref 3.77–5.28)
RDW: 12.7 % (ref 11.7–15.4)
WBC: 9.2 10*3/uL (ref 3.4–10.8)

## 2020-06-03 LAB — COMPREHENSIVE METABOLIC PANEL
ALT: 10 IU/L (ref 0–32)
AST: 8 IU/L (ref 0–40)
Albumin/Globulin Ratio: 1.9 (ref 1.2–2.2)
Albumin: 4.5 g/dL (ref 3.8–4.8)
Alkaline Phosphatase: 106 IU/L (ref 44–121)
BUN/Creatinine Ratio: 11 (ref 9–23)
BUN: 8 mg/dL (ref 6–20)
Bilirubin Total: <0.2 mg/dL (ref 0.0–1.2)
CO2: 21 mmol/L (ref 20–29)
Calcium: 9.1 mg/dL (ref 8.7–10.2)
Chloride: 104 mmol/L (ref 96–106)
Creatinine, Ser: 0.72 mg/dL (ref 0.57–1.00)
Globulin, Total: 2.4 g/dL (ref 1.5–4.5)
Glucose: 89 mg/dL (ref 65–99)
Potassium: 3.6 mmol/L (ref 3.5–5.2)
Sodium: 141 mmol/L (ref 134–144)
Total Protein: 6.9 g/dL (ref 6.0–8.5)
eGFR: 112 mL/min/{1.73_m2} (ref >59)

## 2020-06-03 LAB — LIPASE: Lipase: 22 U/L (ref 14–72)

## 2020-06-03 NOTE — Telephone Encounter (Signed)
Nurses I sent the patient a MyChart message regarding her lab work If possible her general surgery referral please make this urgent so that hopefully they would see her within the next couple weeks due to the amount of discomfort she is having thank you  FYI-she has a up-to-date ultrasound that is being performed today

## 2020-06-03 NOTE — Telephone Encounter (Signed)
Hi Nichole [name], referral to general surgery put in yesterday. I do not have clearance to edit referral notes. Can you change the referral to urgent per dr scott or do I need to put in a new referral and mark it urgent.

## 2020-07-03 ENCOUNTER — Encounter: Payer: Self-pay | Admitting: Family Medicine

## 2020-07-22 ENCOUNTER — Ambulatory Visit: Payer: Self-pay | Admitting: General Surgery

## 2020-08-14 ENCOUNTER — Other Ambulatory Visit: Payer: Self-pay

## 2020-08-14 ENCOUNTER — Encounter (HOSPITAL_BASED_OUTPATIENT_CLINIC_OR_DEPARTMENT_OTHER): Payer: Self-pay | Admitting: General Surgery

## 2020-08-14 NOTE — Progress Notes (Signed)
Spoke w/ via phone for pre-op interview--- Pt Lab needs dos----  Urine preg             Lab results------ no COVID test -----patient states asymptomatic no test needed Arrive at ------- 0900 on 08-21-2020 NPO after MN NO Solid Food.  Clear liquids from MN until--- 080 Med rec completed Medications to take morning of surgery ----- Prilosec Diabetic medication ----- n/a Patient instructed no nail polish to be worn day of surgery Patient instructed to bring photo id and insurance card day of surgery Patient aware to have Driver (ride ) / caregiver for 24 hours after surgery  Patient Special Instructions ----- n/a Pre-Op special Istructions ----- n/a Patient verbalized understanding of instructions that were given at this phone interview. Patient denies shortness of breath, chest pain, fever, cough at this phone interview.

## 2020-08-21 ENCOUNTER — Ambulatory Visit (HOSPITAL_BASED_OUTPATIENT_CLINIC_OR_DEPARTMENT_OTHER)
Admission: RE | Admit: 2020-08-21 | Discharge: 2020-08-21 | Disposition: A | Discharge status: Home / Self Care | Payer: No Typology Code available for payment source | Attending: General Surgery | Admitting: General Surgery

## 2020-08-21 ENCOUNTER — Ambulatory Visit (HOSPITAL_BASED_OUTPATIENT_CLINIC_OR_DEPARTMENT_OTHER): Payer: No Typology Code available for payment source | Admitting: Certified Registered Nurse Anesthetist

## 2020-08-21 ENCOUNTER — Encounter (HOSPITAL_BASED_OUTPATIENT_CLINIC_OR_DEPARTMENT_OTHER): Payer: Self-pay | Admitting: General Surgery

## 2020-08-21 ENCOUNTER — Other Ambulatory Visit: Payer: Self-pay

## 2020-08-21 ENCOUNTER — Encounter (HOSPITAL_BASED_OUTPATIENT_CLINIC_OR_DEPARTMENT_OTHER)
Admission: RE | Disposition: A | Discharge status: Home / Self Care | Payer: Self-pay | Source: Home / Self Care | Attending: General Surgery

## 2020-08-21 DIAGNOSIS — K8010 Calculus of gallbladder with chronic cholecystitis without obstruction: Secondary | ICD-10-CM | POA: Diagnosis not present

## 2020-08-21 DIAGNOSIS — Z79899 Other long term (current) drug therapy: Secondary | ICD-10-CM | POA: Insufficient documentation

## 2020-08-21 DIAGNOSIS — Z793 Long term (current) use of hormonal contraceptives: Secondary | ICD-10-CM | POA: Insufficient documentation

## 2020-08-21 DIAGNOSIS — Z87891 Personal history of nicotine dependence: Secondary | ICD-10-CM | POA: Diagnosis not present

## 2020-08-21 DIAGNOSIS — K8080 Other cholelithiasis without obstruction: Secondary | ICD-10-CM | POA: Diagnosis present

## 2020-08-21 HISTORY — DX: Calculus of gallbladder without cholecystitis without obstruction: K80.20

## 2020-08-21 HISTORY — PX: CHOLECYSTECTOMY: SHX55

## 2020-08-21 HISTORY — DX: Gastro-esophageal reflux disease without esophagitis: K21.9

## 2020-08-21 HISTORY — DX: Calculus of bile duct without cholangitis or cholecystitis without obstruction: K80.50

## 2020-08-21 HISTORY — DX: Personal history of urinary calculi: Z87.442

## 2020-08-21 LAB — POCT PREGNANCY, URINE: Preg Test, Ur: NEGATIVE

## 2020-08-21 SURGERY — LAPAROSCOPIC CHOLECYSTECTOMY
Anesthesia: General | Site: Abdomen

## 2020-08-21 MED ORDER — SCOPOLAMINE 1 MG/3DAYS TD PT72
MEDICATED_PATCH | TRANSDERMAL | Status: AC
  Filled 2020-08-21: qty 1

## 2020-08-21 MED ORDER — PROMETHAZINE HCL 25 MG/ML IJ SOLN: 6.2500 mg | INTRAMUSCULAR | Status: DC | PRN

## 2020-08-21 MED ORDER — EPHEDRINE 5 MG/ML INJ
INTRAVENOUS | Status: AC
  Filled 2020-08-21: qty 5

## 2020-08-21 MED ORDER — SUGAMMADEX SODIUM 200 MG/2ML IV SOLN
INTRAVENOUS | Status: DC | PRN
  Administered 2020-08-21: 200 mg via INTRAVENOUS

## 2020-08-21 MED ORDER — DEXAMETHASONE SODIUM PHOSPHATE 4 MG/ML IJ SOLN
INTRAMUSCULAR | Status: DC | PRN
  Administered 2020-08-21: 5 mg via INTRAVENOUS

## 2020-08-21 MED ORDER — ACETAMINOPHEN 500 MG PO TABS
1000.0000 mg | ORAL_TABLET | ORAL | Status: AC
  Administered 2020-08-21: 1000 mg via ORAL

## 2020-08-21 MED ORDER — OXYCODONE HCL 5 MG/5ML PO SOLN: 5.0000 mg | Freq: Once | ORAL | Status: AC | PRN

## 2020-08-21 MED ORDER — PROPOFOL 10 MG/ML IV BOLUS
INTRAVENOUS | Status: AC
  Filled 2020-08-21: qty 20

## 2020-08-21 MED ORDER — IBUPROFEN 800 MG PO TABS: 800.0000 mg | ORAL_TABLET | Freq: Three times a day (TID) | ORAL | 0 refills | Status: DC | PRN

## 2020-08-21 MED ORDER — FENTANYL CITRATE (PF) 100 MCG/2ML IJ SOLN
INTRAMUSCULAR | Status: AC
  Filled 2020-08-21: qty 2

## 2020-08-21 MED ORDER — ONDANSETRON HCL 4 MG/2ML IJ SOLN
INTRAMUSCULAR | Status: AC
  Filled 2020-08-21: qty 2

## 2020-08-21 MED ORDER — SODIUM CHLORIDE 0.9 % IR SOLN
Status: DC | PRN
  Administered 2020-08-21: 1000 mL

## 2020-08-21 MED ORDER — DEXAMETHASONE SODIUM PHOSPHATE 10 MG/ML IJ SOLN
INTRAMUSCULAR | Status: AC
  Filled 2020-08-21: qty 1

## 2020-08-21 MED ORDER — MIDAZOLAM HCL 2 MG/2ML IJ SOLN
INTRAMUSCULAR | Status: AC
  Filled 2020-08-21: qty 2

## 2020-08-21 MED ORDER — ONDANSETRON HCL 4 MG/2ML IJ SOLN
INTRAMUSCULAR | Status: DC | PRN
  Administered 2020-08-21: 4 mg via INTRAVENOUS

## 2020-08-21 MED ORDER — OXYCODONE HCL 5 MG PO TABS
5.0000 mg | ORAL_TABLET | Freq: Once | ORAL | Status: AC | PRN
  Administered 2020-08-21: 5 mg via ORAL

## 2020-08-21 MED ORDER — ACETAMINOPHEN 160 MG/5ML PO SOLN: 325.0000 mg | ORAL | Status: DC | PRN

## 2020-08-21 MED ORDER — ACETAMINOPHEN 500 MG PO TABS
ORAL_TABLET | ORAL | Status: AC
  Filled 2020-08-21: qty 2

## 2020-08-21 MED ORDER — ROCURONIUM BROMIDE 100 MG/10ML IV SOLN
INTRAVENOUS | Status: DC | PRN
  Administered 2020-08-21: 10 mg via INTRAVENOUS
  Administered 2020-08-21: 50 mg via INTRAVENOUS

## 2020-08-21 MED ORDER — CHLORHEXIDINE GLUCONATE CLOTH 2 % EX PADS: 6.0000 | MEDICATED_PAD | Freq: Once | CUTANEOUS | Status: DC

## 2020-08-21 MED ORDER — LIDOCAINE HCL (CARDIAC) PF 100 MG/5ML IV SOSY
PREFILLED_SYRINGE | INTRAVENOUS | Status: DC | PRN
  Administered 2020-08-21: 60 mg via INTRAVENOUS

## 2020-08-21 MED ORDER — FENTANYL CITRATE (PF) 100 MCG/2ML IJ SOLN: 25.0000 ug | INTRAMUSCULAR | Status: DC | PRN

## 2020-08-21 MED ORDER — CELECOXIB 200 MG PO CAPS
ORAL_CAPSULE | ORAL | Status: AC
  Filled 2020-08-21: qty 1

## 2020-08-21 MED ORDER — 0.9 % SODIUM CHLORIDE (POUR BTL) OPTIME
TOPICAL | Status: DC | PRN
  Administered 2020-08-21: 500 mL

## 2020-08-21 MED ORDER — ACETAMINOPHEN 325 MG PO TABS: 325.0000 mg | ORAL_TABLET | ORAL | Status: DC | PRN

## 2020-08-21 MED ORDER — LACTATED RINGERS IV SOLN
INTRAVENOUS | Status: DC
  Administered 2020-08-21: 1000 mL via INTRAVENOUS

## 2020-08-21 MED ORDER — GLYCOPYRROLATE PF 0.2 MG/ML IJ SOSY
PREFILLED_SYRINGE | INTRAMUSCULAR | Status: AC
  Filled 2020-08-21: qty 1

## 2020-08-21 MED ORDER — FENTANYL CITRATE (PF) 250 MCG/5ML IJ SOLN
INTRAMUSCULAR | Status: AC
  Filled 2020-08-21: qty 5

## 2020-08-21 MED ORDER — CEFAZOLIN SODIUM-DEXTROSE 2-4 GM/100ML-% IV SOLN
INTRAVENOUS | Status: AC
  Filled 2020-08-21: qty 100

## 2020-08-21 MED ORDER — DEXMEDETOMIDINE (PRECEDEX) IN NS 20 MCG/5ML (4 MCG/ML) IV SYRINGE
PREFILLED_SYRINGE | INTRAVENOUS | Status: AC
  Filled 2020-08-21: qty 5

## 2020-08-21 MED ORDER — ROCURONIUM BROMIDE 10 MG/ML (PF) SYRINGE
PREFILLED_SYRINGE | INTRAVENOUS | Status: AC
  Filled 2020-08-21: qty 10

## 2020-08-21 MED ORDER — CEFAZOLIN SODIUM-DEXTROSE 2-4 GM/100ML-% IV SOLN
2.0000 g | INTRAVENOUS | Status: AC
  Administered 2020-08-21: 2 g via INTRAVENOUS

## 2020-08-21 MED ORDER — OXYCODONE HCL 5 MG PO TABS: 5.0000 mg | ORAL_TABLET | Freq: Four times a day (QID) | ORAL | 0 refills | Status: DC | PRN

## 2020-08-21 MED ORDER — FENTANYL CITRATE (PF) 100 MCG/2ML IJ SOLN
INTRAMUSCULAR | Status: DC | PRN
  Administered 2020-08-21 (×2): 50 ug via INTRAVENOUS
  Administered 2020-08-21: 100 ug via INTRAVENOUS
  Administered 2020-08-21: 50 ug via INTRAVENOUS

## 2020-08-21 MED ORDER — KETOROLAC TROMETHAMINE 30 MG/ML IJ SOLN
INTRAMUSCULAR | Status: AC
  Filled 2020-08-21: qty 1

## 2020-08-21 MED ORDER — OXYCODONE HCL 5 MG PO TABS
ORAL_TABLET | ORAL | Status: AC
  Filled 2020-08-21: qty 1

## 2020-08-21 MED ORDER — LIDOCAINE HCL (PF) 2 % IJ SOLN
INTRAMUSCULAR | Status: AC
  Filled 2020-08-21: qty 5

## 2020-08-21 MED ORDER — ACETAMINOPHEN 10 MG/ML IV SOLN: 1000.0000 mg | Freq: Once | INTRAVENOUS | Status: DC | PRN

## 2020-08-21 MED ORDER — CELECOXIB 200 MG PO CAPS
400.0000 mg | ORAL_CAPSULE | ORAL | Status: AC
  Administered 2020-08-21: 400 mg via ORAL

## 2020-08-21 MED ORDER — BUPIVACAINE HCL 0.5 % IJ SOLN
INTRAMUSCULAR | Status: DC | PRN
  Administered 2020-08-21: 30 mL

## 2020-08-21 MED ORDER — MIDAZOLAM HCL 5 MG/5ML IJ SOLN
INTRAMUSCULAR | Status: DC | PRN
  Administered 2020-08-21: 2 mg via INTRAVENOUS

## 2020-08-21 MED ORDER — PROPOFOL 10 MG/ML IV BOLUS
INTRAVENOUS | Status: DC | PRN
  Administered 2020-08-21: 200 mg via INTRAVENOUS

## 2020-08-21 MED ORDER — SCOPOLAMINE 1 MG/3DAYS TD PT72
MEDICATED_PATCH | TRANSDERMAL | Status: DC | PRN
  Administered 2020-08-21: 1 via TRANSDERMAL

## 2020-08-21 MED ORDER — AMISULPRIDE (ANTIEMETIC) 5 MG/2ML IV SOLN: 10.0000 mg | Freq: Once | INTRAVENOUS | Status: DC | PRN

## 2020-08-21 MED ORDER — DEXMEDETOMIDINE (PRECEDEX) IN NS 20 MCG/5ML (4 MCG/ML) IV SYRINGE
PREFILLED_SYRINGE | INTRAVENOUS | Status: DC | PRN
  Administered 2020-08-21: 4 ug via INTRAVENOUS
  Administered 2020-08-21: 8 ug via INTRAVENOUS

## 2020-08-21 MED ORDER — SCOPOLAMINE 1 MG/3DAYS TD PT72: 1.0000 | MEDICATED_PATCH | TRANSDERMAL | Status: DC

## 2020-08-21 SURGICAL SUPPLY — 40 items
ADH SKN CLS APL DERMABOND .7 (GAUZE/BANDAGES/DRESSINGS) ×1
APL PRP STRL LF DISP 70% ISPRP (MISCELLANEOUS) ×2
BAG SPEC RTRVL 10 TROC 200 (ENDOMECHANICALS) ×1
BNDG ADH 1X3 SHEER STRL LF (GAUZE/BANDAGES/DRESSINGS) ×8 IMPLANT
BNDG ADH THN 3X1 STRL LF (GAUZE/BANDAGES/DRESSINGS) ×4
CABLE HIGH FREQUENCY MONO STRZ (ELECTRODE) ×2 IMPLANT
CHLORAPREP W/TINT 26 (MISCELLANEOUS) ×3 IMPLANT
CLIP VESOLOCK MED LG 6/CT (CLIP) ×2 IMPLANT
COVER MAYO STAND STRL (DRAPES) ×2 IMPLANT
DERMABOND ADVANCED (GAUZE/BANDAGES/DRESSINGS) ×1
DERMABOND ADVANCED .7 DNX12 (GAUZE/BANDAGES/DRESSINGS) ×1 IMPLANT
ELECT REM PT RETURN 9FT ADLT (ELECTROSURGICAL) ×2
ELECTRODE REM PT RTRN 9FT ADLT (ELECTROSURGICAL) ×1 IMPLANT
GAUZE 4X4 16PLY ~~LOC~~+RFID DBL (SPONGE) ×2 IMPLANT
GLOVE SURG ENC MOIS LTX SZ6.5 (GLOVE) ×1 IMPLANT
GLOVE SURG POLYISO LF SZ7 (GLOVE) ×3 IMPLANT
GLOVE SURG UNDER POLY LF SZ6.5 (GLOVE) ×1 IMPLANT
GLOVE SURG UNDER POLY LF SZ7 (GLOVE) ×7 IMPLANT
GOWN STRL REUS W/TWL LRG LVL3 (GOWN DISPOSABLE) ×7 IMPLANT
GRASPER SUT TROCAR 14GX15 (MISCELLANEOUS) ×1 IMPLANT
IRRIG SUCT STRYKERFLOW 2 WTIP (MISCELLANEOUS) ×2
IRRIGATION SUCT STRKRFLW 2 WTP (MISCELLANEOUS) ×1 IMPLANT
IV NS 1000ML (IV SOLUTION) ×2
IV NS 1000ML BAXH (IV SOLUTION) IMPLANT
KIT TURNOVER CYSTO (KITS) ×2 IMPLANT
MANIFOLD NEPTUNE II (INSTRUMENTS) ×1 IMPLANT
NS IRRIG 500ML POUR BTL (IV SOLUTION) ×2 IMPLANT
PACK BASIN DAY SURGERY FS (CUSTOM PROCEDURE TRAY) ×2 IMPLANT
POUCH RETRIEVAL ECOSAC 10 (ENDOMECHANICALS) ×1 IMPLANT
POUCH RETRIEVAL ECOSAC 10MM (ENDOMECHANICALS) ×2
SCISSORS LAP 5X35 DISP (ENDOMECHANICALS) ×2 IMPLANT
SET TUBE SMOKE EVAC HIGH FLOW (TUBING) ×2 IMPLANT
SPONGE T-LAP 18X18 ~~LOC~~+RFID (SPONGE) ×2 IMPLANT
SUT MNCRL AB 4-0 PS2 18 (SUTURE) ×2 IMPLANT
SUT VICRYL 0 UR6 27IN ABS (SUTURE) ×1 IMPLANT
TOWEL OR 17X26 10 PK STRL BLUE (TOWEL DISPOSABLE) ×2 IMPLANT
TRAY LAPAROSCOPIC (CUSTOM PROCEDURE TRAY) ×2 IMPLANT
TROCAR BLADELESS OPT 5 100 (ENDOMECHANICALS) ×6 IMPLANT
TROCAR XCEL NON-BLD 11X100MML (ENDOMECHANICALS) ×2 IMPLANT
WARMER LAPAROSCOPE (MISCELLANEOUS) ×2 IMPLANT

## 2020-08-21 NOTE — Anesthesia Preprocedure Evaluation (Addendum)
Anesthesia Evaluation  Patient identified by MRN, date of birth, ID band Patient awake    Reviewed: Allergy & Precautions, NPO status , Patient's Chart, lab work & pertinent test results  Airway Mallampati: I  TM Distance: >3 FB Neck ROM: Full    Dental  (+) Teeth Intact, Dental Advisory Given   Pulmonary former smoker,    breath sounds clear to auscultation       Cardiovascular negative cardio ROS   Rhythm:Regular Rate:Normal     Neuro/Psych negative neurological ROS  negative psych ROS   GI/Hepatic Neg liver ROS, GERD  Medicated,  Endo/Other  negative endocrine ROS  Renal/GU negative Renal ROS     Musculoskeletal negative musculoskeletal ROS (+)   Abdominal Normal abdominal exam  (+)   Peds  Hematology negative hematology ROS (+)   Anesthesia Other Findings   Reproductive/Obstetrics                           Anesthesia Physical Anesthesia Plan  ASA: 2  Anesthesia Plan: General   Post-op Pain Management:    Induction: Intravenous  PONV Risk Score and Plan: 4 or greater and Ondansetron, Dexamethasone, Midazolam and Scopolamine patch - Pre-op  Airway Management Planned: Oral ETT  Additional Equipment: None  Intra-op Plan:   Post-operative Plan: Extubation in OR  Informed Consent: I have reviewed the patients History and Physical, chart, labs and discussed the procedure including the risks, benefits and alternatives for the proposed anesthesia with the patient or authorized representative who has indicated his/her understanding and acceptance.     Dental advisory given  Plan Discussed with: CRNA  Anesthesia Plan Comments:         Anesthesia Quick Evaluation

## 2020-08-21 NOTE — Transfer of Care (Signed)
Immediate Anesthesia Transfer of Care Note  Patient: Nichole [name]  Procedure(s) Performed: LAPAROSCOPIC CHOLECYSTECTOMY (Abdomen)  Patient Location: PACU  Anesthesia Type:General  Level of Consciousness: awake, alert  and oriented  Airway & Oxygen Therapy: Patient Spontanous Breathing and Patient connected to nasal cannula oxygen  Post-op Assessment: Report given to RN and Post -op Vital signs reviewed and stable  Post vital signs: Reviewed and stable  Last Vitals:  Vitals Value Taken Time  BP 131/76 08/21/20 1232  Temp 37.1 C 08/21/20 1231  Pulse 85 08/21/20 1236  Resp 22 08/21/20 1236  SpO2 97 % 08/21/20 1236  Vitals shown include unvalidated device data.  Last Pain:  Vitals:   08/21/20 1231  TempSrc:   PainSc: Asleep      Patients Stated Pain Goal: 5 (08/21/20 0940)  Complications: No notable events documented.

## 2020-08-21 NOTE — Discharge Instructions (Addendum)
[facility] ______[facility] LAPAROSCOPIC SURGERY: POST OP INSTRUCTIONS Always review your discharge instruction sheet given to you by the facility where your surgery was performed. IF YOU HAVE DISABILITY OR FAMILY LEAVE FORMS, YOU MUST BRING THEM TO THE OFFICE FOR PROCESSING.   DO NOT GIVE THEM TO YOUR DOCTOR.  A prescription for pain medication may be given to you upon discharge.  Take your pain medication as prescribed, if needed.  If narcotic pain medicine is not needed, then you may take acetaminophen (Tylenol) or ibuprofen (Advil) as needed. Take your usually prescribed medications unless otherwise directed. If you need a refill on your pain medication, please contact your pharmacy.  They will contact our office to request authorization. Prescriptions will not be filled after 5pm or on week-ends. You should follow a light diet the first few days after arrival home, such as soup and crackers, etc.  Be sure to include lots of fluids daily. Most patients will experience some swelling and bruising in the area of the incisions.  Ice packs will help.  Swelling and bruising can take several days to resolve.  It is common to experience some constipation if taking pain medication after surgery.  Increasing fluid intake and taking a stool softener (such as Colace) will usually help or prevent this problem from occurring.  A mild laxative (Milk of Magnesia or Miralax) should be taken according to package instructions if there are no bowel movements after 48 hours. Unless discharge instructions indicate otherwise, you may remove your bandages 24-48 hours after surgery, and you may shower at that time.  You may have steri-strips (small skin tapes) in place directly over the incision.  These strips should be left on the skin for 7-10 days.  If your surgeon used skin glue on the incision, you may shower in 24 hours.  The glue will flake off over the next 2-3 weeks.  Any sutures or staples will be  removed at the office during your follow-up visit. ACTIVITIES:  You may resume regular (light) daily activities beginning the next day--such as daily self-care, walking, climbing stairs--gradually increasing activities as tolerated.  You may have sexual intercourse when it is comfortable.  Refrain from any heavy lifting or straining until approved by your doctor. You may drive when you are no longer taking prescription pain medication, you can comfortably wear a seatbelt, and you can safely maneuver your car and apply brakes. RETURN TO WORK:  __________________________________________________________ Nichole [name] should see your doctor in the office for a follow-up appointment approximately 2-3 weeks after your surgery.  Make sure that you call for this appointment within a day or two after you arrive home to insure a convenient appointment time. OTHER INSTRUCTIONS: __________________________________________________________________________________________________________________________ __________________________________________________________________________________________________________________________ WHEN TO CALL YOUR DOCTOR: Fever over 101.0 Inability to urinate Continued bleeding from incision. Increased pain, redness, or drainage from the incision. Increasing abdominal pain  The clinic staff is available to answer your questions during regular business hours.  Please don't hesitate to call and ask to speak to one of the nurses for clinical concerns.  If you have a medical emergency, go to the nearest emergency room or call 911.  A surgeon from [facility] is always on call at the hospital. [address], [address], [place], [place]  [address] ? [address] [address], [place], [place]   [address] [phone] ? [phone] ? FAX [phone] Web site: [email or web address]    Post Anesthesia Home Care Instructions  Activity: Get plenty of rest for the  remainder of the day.  A responsible individual must stay with you for 24 hours following the procedure.  For the next 24 hours, DO NOT: -Drive a car -Advertising copywriter -Drink alcoholic beverages -Take any medication unless instructed by your physician -Make any legal decisions or sign important papers.  Meals: Start with liquid foods such as gelatin or soup. Progress to regular foods as tolerated. Avoid greasy, spicy, heavy foods. If nausea and/or vomiting occur, drink only clear liquids until the nausea and/or vomiting subsides. Call your physician if vomiting continues.  Special Instructions/Symptoms: Your throat may feel dry or sore from the anesthesia or the breathing tube placed in your throat during surgery. If this causes discomfort, gargle with warm salt water. The discomfort should disappear within 24 hours.  If you had a scopolamine patch placed behind your ear for the management of post- operative nausea and/or vomiting:  1. The medication in the patch is effective for 72 hours, after which it should be removed.  Wrap patch in a tissue and discard in the trash. Wash hands thoroughly with soap and water. 2. You may remove the patch earlier than 72 hours if you experience unpleasant side effects which may include dry mouth, dizziness or visual disturbances. 3. Avoid touching the patch. Wash your hands with soap and water after contact with the patch.   May take Tylenol at 4 PM.

## 2020-08-21 NOTE — Anesthesia Procedure Notes (Signed)
Procedure Name: Intubation Date/Time: 08/21/2020 10:54 AM Performed by: Cleda Clarks, CRNA Pre-anesthesia Checklist: Patient identified, Emergency Drugs available, Suction available and Patient being monitored Patient Re-evaluated:Patient Re-evaluated prior to induction Oxygen Delivery Method: Circle system utilized Preoxygenation: Pre-oxygenation with 100% oxygen Induction Type: IV induction Ventilation: Mask ventilation without difficulty Laryngoscope Size: Miller and 2 Grade View: Grade I Tube type: Oral Tube size: 7.0 mm Number of attempts: 1 Airway Equipment and Method: Stylet and Oral airway Placement Confirmation: ETT inserted through vocal cords under direct vision, positive ETCO2 and breath sounds checked- equal and bilateral Secured at: 21 cm Tube secured with: Tape Dental Injury: Teeth and Oropharynx as per pre-operative assessment

## 2020-08-21 NOTE — Anesthesia Postprocedure Evaluation (Signed)
Anesthesia Post Note  Patient: Nichole [name]  Procedure(s) Performed: LAPAROSCOPIC CHOLECYSTECTOMY (Abdomen)     Patient location during evaluation: PACU Anesthesia Type: General Level of consciousness: awake and alert Pain management: pain level controlled Vital Signs Assessment: post-procedure vital signs reviewed and stable Respiratory status: spontaneous breathing, nonlabored ventilation, respiratory function stable and patient connected to nasal cannula oxygen Cardiovascular status: blood pressure returned to baseline and stable Postop Assessment: no apparent nausea or vomiting Anesthetic complications: no   No notable events documented.  Last Vitals:  Vitals:   08/21/20 1300 08/21/20 1315  BP: 119/69 122/64  Pulse: 76 64  Resp: 18 17  Temp:    SpO2: 97% 97%    Last Pain:  Vitals:   08/21/20 1300  TempSrc:   PainSc: 4                  Shelton Silvas

## 2020-08-21 NOTE — Op Note (Signed)
PATIENT:  Nichole [name]  35 y.o. female  PRE-OPERATIVE DIAGNOSIS:  GALLSTONES  POST-OPERATIVE DIAGNOSIS:  GALLSTONES  PROCEDURE:  Procedure(s): LAPAROSCOPIC CHOLECYSTECTOMY  SURGEON:  Kinsinger, De Blanch, MD   ASSISTANT: Susa Day, MD  ANESTHESIA:   local and general  Indications for procedure: [name] is a 35 y.o. female with symptoms of Abdominal pain consistent with gallbladder disease, Confirmed by ultrasound.  Description of procedure: The patient was brought into the operative suite, placed supine. Anesthesia was administered with endotracheal tube. Patient was strapped in place and foot board was secured. All pressure points were offloaded by foam padding. The patient was prepped and draped in the usual sterile fashion.  A periumbilical incision was made and optical entry was used to enter the abdomen. 2 5 mm trocars were placed on in the right lateral space on in the right subcostal space. A 20mm trocar was placed in the subxiphoid space. Marcaine was infused to the subxiphoid space and lateral upper right abdomen in the transversus abdominis plane. Next the patient was placed in reverse trendelenberg. The gallbladder appearedminimally inflamed. Omentum was adhered to the gallbladder and was taken down with cautery/blunt dissection.  The gallbladder was retracted cephalad and lateral. The peritoneum was reflected off the infundibulum working lateral to medial. The cystic duct and cystic artery were identified and further dissection revealed a critical view. The cystic duct and cystic artery were doubly clipped and ligated.   The gallbladder was removed off the liver bed with cautery. The Gallbladder was placed in a specimen bag. The gallbladder fossa was irrigated and hemostasis was applied with cautery. The gallbladder was removed via the 42mm trocar. The fascial defect was closed with interrupted 0 vicryl suture via laparoscopic trans-fascial suture passer.  Pneumoperitoneum was removed, all trocar were removed. All incisions were closed with 4-0 monocryl subcuticular stitch. The patient woke from anesthesia and was brought to PACU in stable condition. All counts were correct  Findings: Minimally inflamed gallbladder with large stone and sludge  Specimen: gallbladder  Blood loss: 15 ml  Local anesthesia: 10 ml Marcaine  Complications: No immediate complications  PLAN OF CARE: Discharge to home after PACU  PATIENT DISPOSITION:  PACU - hemodynamically stable.  Images:    [facility], [place]

## 2020-08-21 NOTE — H&P (Signed)
Nichole [name] is an 35 y.o. female.   Chief Complaint: abdominal pain HPI: 35 yo female with abdominal pain and work up consistent gallstone disease.  Past Medical History:  Diagnosis Date   Biliary colic    Cholelithiasis    GERD (gastroesophageal reflux disease)    History of cervical dysplasia 2008   s/p  laser abation of cervix   History of kidney stones    Hyperlipidemia     Past Surgical History:  Procedure Laterality Date   LASER ABLATION OF THE CERVIX  08/30/2006   @ [facility]    Family History  Problem Relation Age of Onset   Diabetes Maternal Grandmother    Diabetes Maternal Grandfather    Congestive Heart Failure Maternal Grandfather    Heart disease Paternal Grandfather    Social History:  reports that she quit smoking about 17 years ago. Her smoking use included cigarettes. She has never used smokeless tobacco. She reports that she does not drink alcohol and does not use drugs.  Allergies: No Known Allergies  Medications Prior to Admission  Medication Sig Dispense Refill   cetirizine (ZYRTEC) 10 MG tablet Take 10 mg by mouth as needed for allergies.     ibuprofen (ADVIL) 200 MG tablet Take 600 mg by mouth every 6 (six) hours as needed.     norgestimate-ethinyl estradiol (SPRINTEC 28) 0.25-35 MG-MCG tablet Take 1 tablet by mouth daily. (Patient taking differently: Take 1 tablet by mouth at bedtime.) 84 tablet 4   omeprazole (PRILOSEC) 10 MG capsule Take 10 mg by mouth daily.     Multiple Vitamin (MULTIVITAMIN WITH MINERALS) TABS tablet Take 1 tablet by mouth once a week.      Results for orders placed or performed during the hospital encounter of 08/21/20 (from the past 48 hour(s))  Pregnancy, urine POC     Status: None   Collection Time: 08/21/20  9:30 AM  Result Value Ref Range   Preg Test, Ur NEGATIVE NEGATIVE    Comment:        THE SENSITIVITY OF THIS METHODOLOGY IS >24 mIU/mL    No results found.  Review of Systems  Constitutional:  Negative for  chills and fever.  HENT:  Negative for hearing loss.   Respiratory:  Negative for cough.   Cardiovascular:  Negative for chest pain and palpitations.  Gastrointestinal:  Negative for abdominal pain, nausea and vomiting.  Genitourinary:  Negative for dysuria and urgency.  Musculoskeletal:  Negative for myalgias and neck pain.  Skin:  Negative for rash.  Neurological:  Negative for dizziness and headaches.  Hematological:  Does not bruise/bleed easily.  Psychiatric/Behavioral:  Negative for suicidal ideas.    Blood pressure 132/76, pulse 70, temperature 98.4 F (36.9 C), temperature source Oral, resp. rate 17, height 5\' 4"  (1.626 m), weight 78.3 kg, last menstrual period 07/30/2020, SpO2 100 %. Physical Exam Vitals reviewed.  Constitutional:      Appearance: She is well-developed.  HENT:     Head: Normocephalic and atraumatic.  Eyes:     Conjunctiva/sclera: Conjunctivae normal.     Pupils: Pupils are equal, round, and reactive to light.  Cardiovascular:     Rate and Rhythm: Normal rate and regular rhythm.  Pulmonary:     Effort: Pulmonary effort is normal.     Breath sounds: Normal breath sounds.  Abdominal:     General: Bowel sounds are normal. There is no distension.     Palpations: Abdomen is soft.     Tenderness:  There is no abdominal tenderness.  Musculoskeletal:        General: Normal range of motion.     Cervical back: Normal range of motion and neck supple.  Skin:    General: Skin is warm and dry.  Neurological:     Mental Status: She is alert and oriented to person, place, and time.  Psychiatric:        Behavior: Behavior normal.     Assessment/Plan 35 yo female with gallstones -lap chole -outpatient procedure -[facility] protocol  Rodman Pickle, MD 08/21/2020, 10:21 AM

## 2020-08-24 ENCOUNTER — Encounter (HOSPITAL_BASED_OUTPATIENT_CLINIC_OR_DEPARTMENT_OTHER): Payer: Self-pay | Admitting: General Surgery

## 2020-08-24 LAB — SURGICAL PATHOLOGY

## 2020-08-28 ENCOUNTER — Encounter: Payer: Self-pay | Admitting: Obstetrics & Gynecology

## 2020-08-28 ENCOUNTER — Ambulatory Visit (INDEPENDENT_AMBULATORY_CARE_PROVIDER_SITE_OTHER): Payer: No Typology Code available for payment source | Admitting: Obstetrics & Gynecology

## 2020-08-28 ENCOUNTER — Other Ambulatory Visit: Payer: Self-pay

## 2020-08-28 VITALS — BP 125/80 | HR 85 | Ht 64.0 in | Wt 168.0 lb

## 2020-08-28 DIAGNOSIS — Z01419 Encounter for gynecological examination (general) (routine) without abnormal findings: Secondary | ICD-10-CM | POA: Diagnosis not present

## 2020-08-28 DIAGNOSIS — Z3041 Encounter for surveillance of contraceptive pills: Secondary | ICD-10-CM

## 2020-08-28 MED ORDER — NORGESTIMATE-ETH ESTRADIOL 0.25-35 MG-MCG PO TABS: 1.0000 | ORAL_TABLET | Freq: Every day | ORAL | 4 refills | Status: DC

## 2020-08-28 NOTE — Progress Notes (Signed)
WELL-WOMAN EXAMINATION Patient name: Nichole [name] MRN [id number]  Date of birth: [birth year] Chief Complaint:   Gynecologic Exam  History of Present Illness:   Nichole [name] is a 35 y.o. [phone]  female being seen today for a routine well-woman exam.  Today she notes: no acute complaints   Patient's last menstrual period was 08/28/2020 (approximate). Denies issues with her menses  The current method of family planning is OCP (estrogen/progesterone).   Doing well with current pills and wishes to continue.  Recently had gallbladder surgery- doing much better now. Works for [employer] for ~47yrs.   Last pap 09/2019- Pap/HPV neg Last mammogram: n/a. Last colonoscopy: n/a  Depression screen [facility] 2/9 08/28/2020 06/02/2020 09/24/2019 02/04/2019 08/22/2017  Decreased Interest 0 0 0 0 0  Down, Depressed, Hopeless 0 0 0 0 0  PHQ - 2 Score 0 0 0 0 0  Altered sleeping 0 - 0 - 0  Tired, decreased energy 0 - 3 - 0  Change in appetite 0 - 0 - 0  Feeling bad or failure about yourself  0 - 0 - 0  Trouble concentrating 0 - 0 - 0  Moving slowly or fidgety/restless 0 - 0 - 0  Suicidal thoughts 0 - 0 - 0  PHQ-9 Score 0 - 3 - 0  Difficult doing work/chores - - Not difficult at all - -      Review of Systems:   Pertinent items are noted in HPI Denies any headaches, blurred vision, fatigue, shortness of breath, chest pain, abdominal pain, bowel movements, urination, or intercourse unless otherwise stated above.  Pertinent History Reviewed:  Reviewed past medical,surgical, social and family history.  Reviewed problem list, medications and allergies. Physical Assessment:   Vitals:   08/28/20 1031  BP: 125/80  Pulse: 85  Weight: 168 lb (76.2 kg)  Height: 5\' 4"  (1.626 m)  Body mass index is 28.84 kg/m.        Physical Examination:   General appearance - well appearing, and in no distress  Mental status - alert, oriented to person, place, and time  Psych:  She has a normal mood and  affect  Skin - warm and dry, normal color, no suspicious lesions noted  Chest - effort normal, all lung fields clear to auscultation bilaterally  Heart - normal rate and regular rhythm  Neck:  midline trachea, no thyromegaly or nodules  Breasts - breasts appear normal, no suspicious masses, no skin or nipple changes or  axillary nodes  Abdomen - soft, nontender, nondistended, no masses or organomegaly, incisions healing appropriately with dermabond  Pelvic - VULVA: normal appearing vulva with no masses, tenderness or lesions  VAGINA: normal appearing vagina with normal color and discharge, no lesions, menses just started  CERVIX: normal appearing cervix without discharge or lesions, no CMT  Pap not completed  UTERUS: uterus is felt to be normal size, shape, consistency and nontender   ADNEXA: No adnexal masses or tenderness noted.  Extremities:  No swelling or varicosities noted  Chaperone:     Assessment & Plan:  1) Well-Woman Exam Pap up to date, reviewed guidelines  2) Contraceptive management Doing well with OCPs and plan to continue  No orders of the defined types were placed in this encounter.   Meds:  Meds ordered this encounter  Medications   norgestimate-ethinyl estradiol (SPRINTEC 28) 0.25-35 MG-MCG tablet    Sig: Take 1 tablet by mouth daily.    Dispense:  90 tablet  Refill:  4    Follow-up: Return in about 1 year (around 08/28/2021) for Annual.   Nichole Hidalgo, DO Attending Obstetrician & Gynecologist, Faculty Practice Center for [facility], [facility]

## 2020-09-24 ENCOUNTER — Other Ambulatory Visit: Payer: No Typology Code available for payment source | Admitting: Adult Health

## 2021-09-07 ENCOUNTER — Ambulatory Visit (INDEPENDENT_AMBULATORY_CARE_PROVIDER_SITE_OTHER): Payer: No Typology Code available for payment source | Admitting: Adult Health

## 2021-09-07 ENCOUNTER — Encounter: Payer: Self-pay | Admitting: Adult Health

## 2021-09-07 VITALS — BP 134/78 | HR 74 | Ht 64.0 in | Wt 195.0 lb

## 2021-09-07 DIAGNOSIS — Z01419 Encounter for gynecological examination (general) (routine) without abnormal findings: Secondary | ICD-10-CM | POA: Diagnosis not present

## 2021-09-07 DIAGNOSIS — Z3041 Encounter for surveillance of contraceptive pills: Secondary | ICD-10-CM

## 2021-09-07 MED ORDER — NORGESTIMATE-ETH ESTRADIOL 0.25-35 MG-MCG PO TABS: 1.0000 | ORAL_TABLET | Freq: Every day | ORAL | 4 refills | Status: DC

## 2021-09-07 NOTE — Progress Notes (Signed)
Patient ID: Nichole [name], female   DOB: 1985/10/12, 36 y.o.   MRN: [id number] History of Present Illness: Nichole [name] is a 36 year old white female,married, [id number], in for a well woman gyn exam.  Last pap was 09/24/19. High risk HPV Negative   Adequacy Satisfactory for evaluation; transformation zone component PRESENT.   Diagnosis - Negative for intraepithelial lesion or malignancy (NILM  PCP is Dr Lilyan Punt.  Current Medications, Allergies, Past Medical History, Past Surgical History, Family History and Social History were reviewed in [employer] record.     Review of Systems: Patient denies any headaches, hearing loss, fatigue, blurred vision, shortness of breath, chest pain, abdominal pain, problems with bowel movements, urination, or intercourse. No joint pain or mood swings.  Happy with OCs.   Physical Exam:BP 134/78 (BP Location: Left Arm, Patient Position: Sitting, Cuff Size: Normal)   Pulse 74   Ht 5\' 4"  (1.626 m)   Wt 195 lb (88.5 kg)   LMP 08/28/2021   BMI 33.47 kg/m   General:  Well developed, well nourished, no acute distress Skin:  Warm and dry Neck:  Midline trachea, normal thyroid, good ROM, no lymphadenopathy Lungs; Clear to auscultation bilaterally Breast:  No dominant palpable mass, retraction, or nipple discharge Cardiovascular: Regular rate and rhythm Abdomen:  Soft, non tender, no hepatosplenomegaly Pelvic:  External genitalia is normal in appearance, no lesions.  The vagina is normal in appearance. Urethra has no lesions or masses. The cervix is bulbous, slightly everted at os, ans has several small nabothian cysts anterior lip of cervix.08/30/2021  Uterus is felt to be normal size, shape, and contour.  No adnexal masses or tenderness noted.Bladder is non tender, no masses felt. Extremities/musculoskeletal:  No swelling or varicosities noted, no clubbing or cyanosis Psych:  No mood changes, alert and cooperative,seems happy AA is 0 Fall risk is  low    09/07/2021    8:30 AM 08/28/2020   10:30 AM 06/02/2020    1:59 PM  Depression screen PHQ 2/9  Decreased Interest 0 0 0  Down, Depressed, Hopeless 0 0 0  PHQ - 2 Score 0 0 0  Altered sleeping 0 0   Tired, decreased energy 0 0   Change in appetite 0 0   Feeling bad or failure about yourself  0 0   Trouble concentrating 0 0   Moving slowly or fidgety/restless 0 0   Suicidal thoughts 0 0   PHQ-9 Score 0 0        09/07/2021    8:30 AM 08/28/2020   10:30 AM 09/24/2019    9:26 AM  GAD 7 : Generalized Anxiety Score  Nervous, Anxious, on Edge 0 0 0  Control/stop worrying 0 0 0  Worry too much - different things 0 0 0  Trouble relaxing 0 0 0  Restless 0 0 0  Easily annoyed or irritable 0 0 0  Afraid - awful might happen 0 0 0  Total GAD 7 Score 0 0 0  Anxiety Difficulty   Not difficult at all      Upstream - 09/07/21 09/09/21       Pregnancy Intention Screening   Does the patient want to become pregnant in the next year? No    Does the patient's partner want to become pregnant in the next year? No    Would the patient like to discuss contraceptive options today? No      Contraception Wrap Up   Current Method Oral Contraceptive  End Method Oral Contraceptive            Examination chaperoned by Malachy Mood LPN  Impression and plan: 1. Encounter for well woman exam with routine gynecological exam Pap and physical in 1 year Had labs at work, will get a copy to me or PCP to scan in  2. Encounter for surveillance of contraceptive pills Will continue OCs Meds ordered this encounter  Medications   norgestimate-ethinyl estradiol (ORTHO-CYCLEN) 0.25-35 MG-MCG tablet    Sig: Take 1 tablet by mouth daily.    Dispense:  84 tablet    Refill:  4    Order Specific Question:   Supervising Provider    Answer:   Duane Lope H [[id number]]

## 2022-09-09 ENCOUNTER — Other Ambulatory Visit (HOSPITAL_COMMUNITY)
Admission: RE | Admit: 2022-09-09 | Discharge: 2022-09-09 | Disposition: A | Discharge status: Home / Self Care | Payer: No Typology Code available for payment source | Source: Ambulatory Visit | Attending: Obstetrics & Gynecology | Admitting: Obstetrics & Gynecology

## 2022-09-09 ENCOUNTER — Encounter: Payer: Self-pay | Admitting: Obstetrics & Gynecology

## 2022-09-09 ENCOUNTER — Ambulatory Visit: Payer: No Typology Code available for payment source | Admitting: Obstetrics & Gynecology

## 2022-09-09 VITALS — BP 129/83 | HR 87 | Ht 64.0 in | Wt 203.0 lb

## 2022-09-09 DIAGNOSIS — Z01419 Encounter for gynecological examination (general) (routine) without abnormal findings: Secondary | ICD-10-CM

## 2022-09-09 MED ORDER — NORGESTIMATE-ETH ESTRADIOL 0.25-35 MG-MCG PO TABS: 1.0000 | ORAL_TABLET | Freq: Every day | ORAL | 4 refills | Status: DC

## 2022-09-09 NOTE — Addendum Note (Signed)
Addended by: Freddie Apley R on: 09/09/2022 10:26 AM   Modules accepted: Orders

## 2022-09-09 NOTE — Progress Notes (Signed)
Subjective:     Nichole [name] is a 37 y.o. female here for a routine exam.  Patient's last menstrual period was 08/27/2022 (exact date). [id number] Birth Control Method:  COC ortho cyclen Menstrual Calendar(currently): regular  Current complaints: none.   Current acute medical issues:  none   Recent Gynecologic History Patient's last menstrual period was 08/27/2022 (exact date). Last Pap: 2021,  normal Last mammogram: na,    Past Medical History:  Diagnosis Date   Biliary colic    Cholelithiasis    GERD (gastroesophageal reflux disease)    History of cervical dysplasia 2008   s/p  laser abation of cervix   History of kidney stones    Hyperlipidemia     Past Surgical History:  Procedure Laterality Date   CHOLECYSTECTOMY N/A 08/21/2020   Procedure: LAPAROSCOPIC CHOLECYSTECTOMY;  Surgeon: Kinsinger, De Blanch, MD;  Location: [facility];  Service: General;  Laterality: N/A;   LASER ABLATION OF THE CERVIX  08/30/2006   @ [facility]    OB History     Gravida  4   Para  3   Term  3   Preterm  0   AB  1   Living  3      SAB  1   IAB  0   Ectopic  0   Multiple  0   Live Births  3           Social History   Socioeconomic History   Marital status: Married    Spouse name: Not on file   Number of children: 3   Years of education: Not on file   Highest education level: Not on file  Occupational History   Not on file  Tobacco Use   Smoking status: Former    Current packs/day: 0.00    Types: Cigarettes    Start date: 2004    Quit date: 2005    Years since quitting: 19.6   Smokeless tobacco: Never  Vaping Use   Vaping status: Never Used  Substance and Sexual Activity   Alcohol use: No   Drug use: Never   Sexual activity: Yes    Birth control/protection: Pill  Other Topics Concern   Not on file  Social History Narrative   Not on file   Social Determinants of Health   Financial Resource Strain: Low Risk  (09/09/2022)   Overall  Financial Resource Strain (CARDIA)    Difficulty of Paying Living Expenses: Not hard at all  Food Insecurity: No Food Insecurity (09/09/2022)   Hunger Vital Sign    Worried About Running Out of Food in the Last Year: Never true    Ran Out of Food in the Last Year: Never true  Transportation Needs: No Transportation Needs (09/09/2022)   PRAPARE - Administrator, Civil Service (Medical): No    Lack of Transportation (Non-Medical): No  Physical Activity: Insufficiently Active (09/09/2022)   Exercise Vital Sign    Days of Exercise per Week: 2 days    Minutes of Exercise per Session: 30 min  Stress: No Stress Concern Present (09/09/2022)   [employer] of Occupational Health - Occupational Stress Questionnaire    Feeling of Stress : Only a little  Social Connections: Moderately Integrated (09/09/2022)   Social Connection and Isolation Panel [NHANES]    Frequency of Communication with Friends and Family: More than three times a week    Frequency of Social Gatherings with Friends and Family: Twice a week  Attends Religious Services: More than 4 times per year    Active Member of Clubs or Organizations: No    Attends Banker Meetings: Never    Marital Status: Married    Family History  Problem Relation Age of Onset   Diabetes Maternal Grandmother    Diabetes Maternal Grandfather    Congestive Heart Failure Maternal Grandfather    Heart disease Paternal Grandfather      Current Outpatient Medications:    norgestimate-ethinyl estradiol (ORTHO-CYCLEN) 0.25-35 MG-MCG tablet, Take 1 tablet by mouth daily., Disp: 84 tablet, Rfl: 4  Review of Systems  Review of Systems  Constitutional: Negative for fever, chills, weight loss, malaise/fatigue and diaphoresis.  HENT: Negative for hearing loss, ear pain, nosebleeds, congestion, sore throat, neck pain, tinnitus and ear discharge.   Eyes: Negative for blurred vision, double vision, photophobia, pain, discharge and  redness.  Respiratory: Negative for cough, hemoptysis, sputum production, shortness of breath, wheezing and stridor.   Cardiovascular: Negative for chest pain, palpitations, orthopnea, claudication, leg swelling and PND.  Gastrointestinal: negative for abdominal pain. Negative for heartburn, nausea, vomiting, diarrhea, constipation, blood in stool and melena.  Genitourinary: Negative for dysuria, urgency, frequency, hematuria and flank pain.  Musculoskeletal: Negative for myalgias, back pain, joint pain and falls.  Skin: Negative for itching and rash.  Neurological: Negative for dizziness, tingling, tremors, sensory change, speech change, focal weakness, seizures, loss of consciousness, weakness and headaches.  Endo/Heme/Allergies: Negative for environmental allergies and polydipsia. Does not bruise/bleed easily.  Psychiatric/Behavioral: Negative for depression, suicidal ideas, hallucinations, memory loss and substance abuse. The patient is not nervous/anxious and does not have insomnia.        Objective:  Blood pressure 129/83, pulse 87, height 5\' 4"  (1.626 m), weight 203 lb (92.1 kg), last menstrual period 08/27/2022.   Physical Exam  Vitals reviewed. Constitutional: She is oriented to person, place, and time. She appears well-developed and well-nourished.  HENT:  Head: Normocephalic and atraumatic.        Right Ear: External ear normal.  Left Ear: External ear normal.  Nose: Nose normal.  Mouth/Throat: Oropharynx is clear and moist.  Eyes: Conjunctivae and EOM are normal. Pupils are equal, round, and reactive to light. Right eye exhibits no discharge. Left eye exhibits no discharge. No scleral icterus.  Neck: Normal range of motion. Neck supple. No tracheal deviation present. No thyromegaly present.  Cardiovascular: Normal rate, regular rhythm, normal heart sounds and intact distal pulses.  Exam reveals no gallop and no friction rub.   No murmur heard. Respiratory: Effort normal and  breath sounds normal. No respiratory distress. She has no wheezes. She has no rales. She exhibits no tenderness.  GI: Soft. Bowel sounds are normal. She exhibits no distension and no mass. There is no tenderness. There is no rebound and no guarding.  Genitourinary:  Breasts no masses skin changes or nipple changes bilaterally      Vulva is normal without lesions Vagina is pink moist without discharge Cervix normal in appearance and pap is done Uterus is normal size shape and contour Adnexa is negative with normal sized ovaries   Musculoskeletal: Normal range of motion. She exhibits no edema and no tenderness.  Neurological: She is alert and oriented to person, place, and time. She has normal reflexes. She displays normal reflexes. No cranial nerve deficit. She exhibits normal muscle tone. Coordination normal.  Skin: Skin is warm and dry. No rash noted. No erythema. No pallor.  Psychiatric: She has a normal mood  and affect. Her behavior is normal. Judgment and thought content normal.       Medications Ordered at today's visit: Meds ordered this encounter  Medications   norgestimate-ethinyl estradiol (ORTHO-CYCLEN) 0.25-35 MG-MCG tablet    Sig: Take 1 tablet by mouth daily.    Dispense:  84 tablet    Refill:  4    Other orders placed at today's visit: No orders of the defined types were placed in this encounter.     Assessment:    Normal Gyn exam.    Plan:    Contraception: OCP (estrogen/progesterone). Follow up in: 3 years.     Return in about 3 years (around 09/08/2025), or if symptoms worsen or fail to improve.

## 2022-09-15 LAB — CYTOLOGY - PAP
Comment: NEGATIVE
Diagnosis: NEGATIVE
High risk HPV: NEGATIVE

## 2022-11-07 ENCOUNTER — Encounter: Payer: Self-pay | Admitting: Podiatry

## 2022-11-07 ENCOUNTER — Ambulatory Visit (INDEPENDENT_AMBULATORY_CARE_PROVIDER_SITE_OTHER): Payer: No Typology Code available for payment source

## 2022-11-07 ENCOUNTER — Ambulatory Visit: Payer: No Typology Code available for payment source | Admitting: Podiatry

## 2022-11-07 DIAGNOSIS — M7662 Achilles tendinitis, left leg: Secondary | ICD-10-CM | POA: Diagnosis not present

## 2022-11-07 DIAGNOSIS — M79672 Pain in left foot: Secondary | ICD-10-CM

## 2022-11-07 DIAGNOSIS — M9262 Juvenile osteochondrosis of tarsus, left ankle: Secondary | ICD-10-CM

## 2022-11-07 DIAGNOSIS — R262 Difficulty in walking, not elsewhere classified: Secondary | ICD-10-CM

## 2022-11-07 MED ORDER — TRIAMCINOLONE ACETONIDE 10 MG/ML IJ SUSP
5.0000 mg | Freq: Once | INTRAMUSCULAR | Status: AC
Start: 2022-11-07 — End: 2022-11-07
  Administered 2022-11-07: 5 mg via INTRAMUSCULAR

## 2022-11-07 MED ORDER — MELOXICAM 7.5 MG PO TABS: 7.5000 mg | ORAL_TABLET | Freq: Every day | ORAL | 0 refills | Status: AC

## 2022-11-07 NOTE — Progress Notes (Signed)
HPI: 37 y.o. female presenting today with c/o pain in the back of the left heel.  States that she was treated for this back in 2020 by Dr. Samuella Cota.  She had been placed in a boot at that time and did have improvement.  She states that her symptoms have been back for the past 6 months.  Denies injury or any specific initiating factor for the recurrence.  From the previous note by Dr. Samuella Cota he had discussed shockwave therapy with her in the past but she did not proceed with this.  She notes she has a boot at home but when she started describing it sounds like she has the night splint.  She did confirm she was not supposed to walk in the boot which is typical for the night splint.  Past Medical History:  Diagnosis Date   Biliary colic    Cholelithiasis    GERD (gastroesophageal reflux disease)    History of cervical dysplasia 2008   s/p  laser abation of cervix   History of kidney stones    Hyperlipidemia     Past Surgical History:  Procedure Laterality Date   CHOLECYSTECTOMY N/A 08/21/2020   Procedure: LAPAROSCOPIC CHOLECYSTECTOMY;  Surgeon: Kinsinger, De Blanch, MD;  Location: [facility];  Service: General;  Laterality: N/A;   LASER ABLATION OF THE CERVIX  08/30/2006   @ [facility]    No Known Allergies   Physical Exam: General: The patient is alert and oriented x3 in no acute distress.  Dermatology:  No ecchymosis, erythema, or edema bilateral.  No open lesions.    Vascular: Palpable pedal pulses bilaterally. Capillary refill within normal limits.  No appreciable edema.    Neurological: Light touch sensation intact bilateral.  MMT 5/5 to lower extremity bilateral. Negative Tinel's sign with percussion of the posterior tibial nerve on the affected extremity.    Musculoskeletal Exam:  There is pain on palpation of the posterior aspect of the left heel.  No palpable gaps or nodules noted within the achilles tendon.  Antalgic gait noted with first steps out of exam chair.   No pain on palpation of the plantar heel.  No ecchymosis or erythema to posterior heel.  Radiographic Exam (left foot, 3 weightbearing views, 11/07/2022):  Normal osseous mineralization. Joint spaces preserved.  There is moderate enlargement of the posterior superior aspect of the calcaneus on the left.  No fracture is seen.  There is also thickening of the Achilles tendon is seen on the lateral view and near its insertion.  No gaps are seen within the Achilles tendon on the soft tissue portion of the radiographs.  Assessment/Plan of Care: 1. Achilles tendinitis, left leg   2. Left foot pain   3. Haglund's deformity of left heel   4. Difficulty walking     Meds ordered this encounter  Medications   meloxicam (MOBIC) 7.5 MG tablet    Sig: Take 1 tablet (7.5 mg total) by mouth daily.    Dispense:  30 tablet    Refill:  0   triamcinolone acetonide (KENALOG) 10 MG/ML injection 5 mg   PR PNEUMAT WALKING BOOT PRE CST  -Reviewed etiology of achilles tendonitis with patient.  Discussed treatment options with patient today, including cortisone injection, NSAID course of treatment, stretching exercises, use of night splint, physical therapy, rest, icing the heel, arch supports/orthotics, and supportive shoe gear.    With the patient's verbal consent, a corticosteroid injection was adminstered to the posterior  aspect of the left heel.  This consisted of a mixture of 1% lidocaine plain, 0.5% sensorcaine plain, and Kenalog-10 for a total of 1.0cc's administered.  Bandaid applied. Patient tolerated this well.     She was fitted for a pneumatic cam walker today.  She will wear this over the next 5 to 7 days to help immobilize the ankle and not aggravate the tendon while the cortisone is providing some improvement and anti-inflammatory effect.  She can resume her stretching exercises in 1 week.  Prescription for meloxicam 7.5 mg 1 tablet p.o. daily was sent to her pharmacy.  We briefly discussed  shockwave therapy and PRP therapy as options.  We also discussed physical therapy as an option.  I feel she would benefit most from the PRP therapy over these 3 options at this time if she does not improve with today's injection.  Follow-up in 4 to 5 weeks for recheck or as needed  Clerance Lav, DPM, FACFAS [facility]     [address] [place], [place] [address]                Office [phone]  Fax [phone]

## 2022-11-28 ENCOUNTER — Encounter: Payer: No Typology Code available for payment source | Admitting: Podiatry

## 2022-11-28 NOTE — Progress Notes (Signed)
Patient was a no-show for today's scheduled appointment.

## 2022-12-16 IMAGING — US US ABDOMEN LIMITED
1 series · 14 of 25 positions shown · non-contrast
Comparison: 09/02/2012

CLINICAL DATA: Epigastric pain for 2 weeks, history of
cholelithiasis

EXAM:
ULTRASOUND ABDOMEN LIMITED RIGHT UPPER QUADRANT

[Series 1: us abdomen limited ruq (liver/gb) · 14 of 57 slices shown]
[im 1/57]
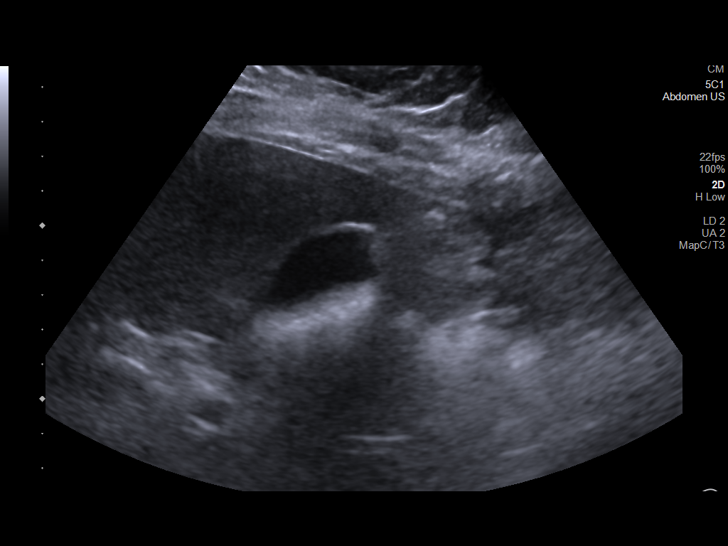
[im 5/57]
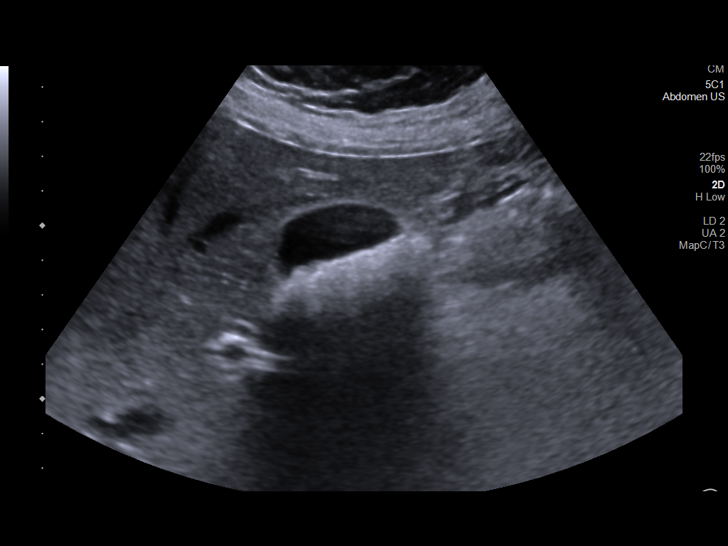
[im 10/57]
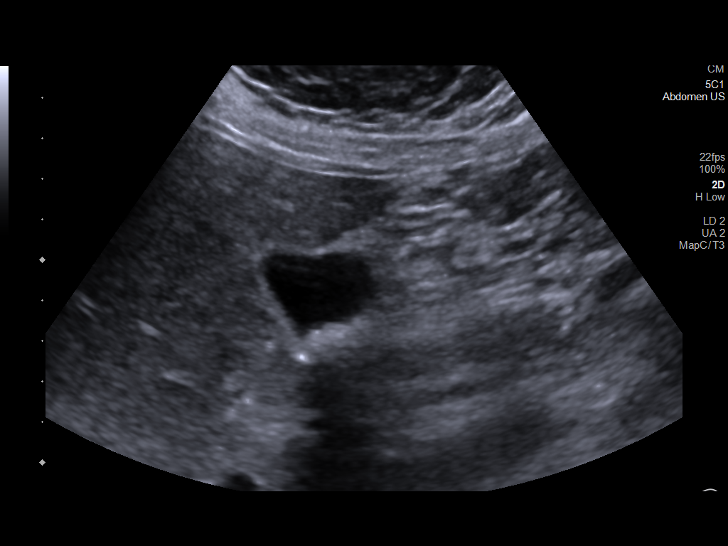
[im 15/57]
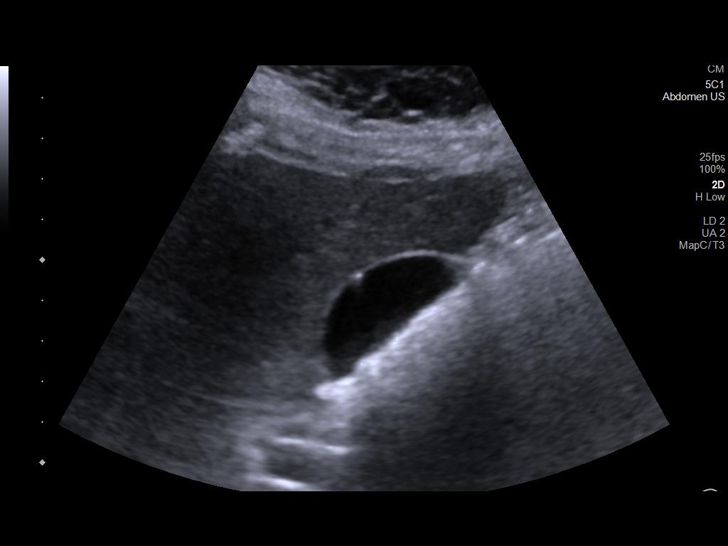
[im 19/57]
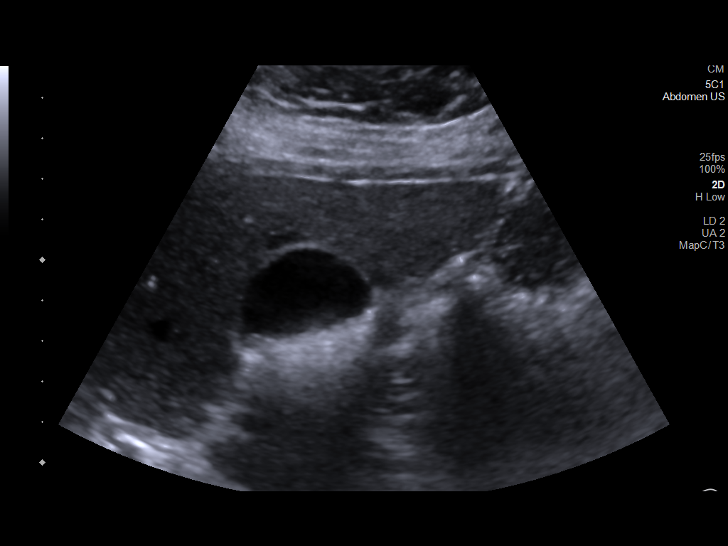
[im 22/57]
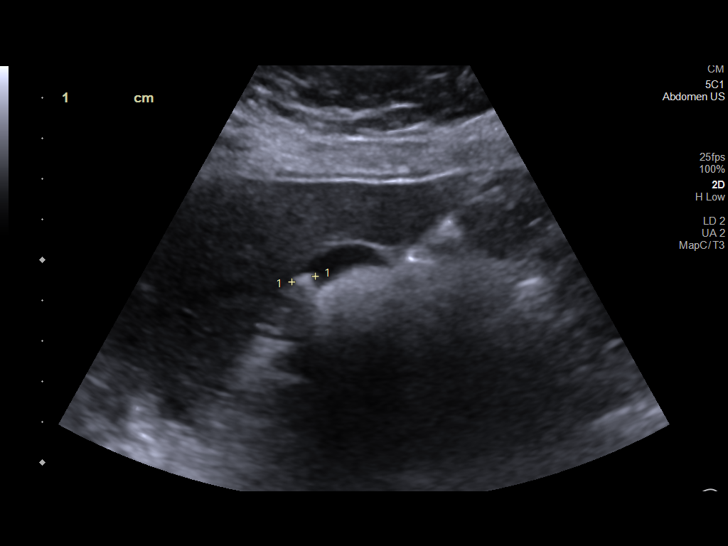
[im 26/57]
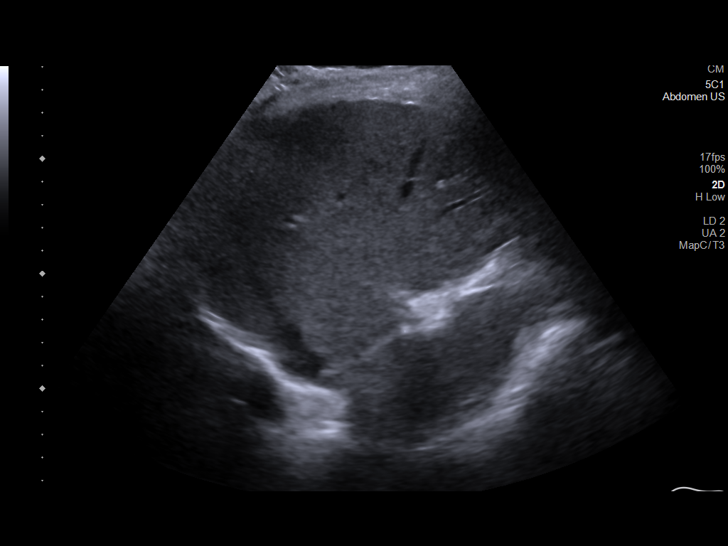
[im 31/57]
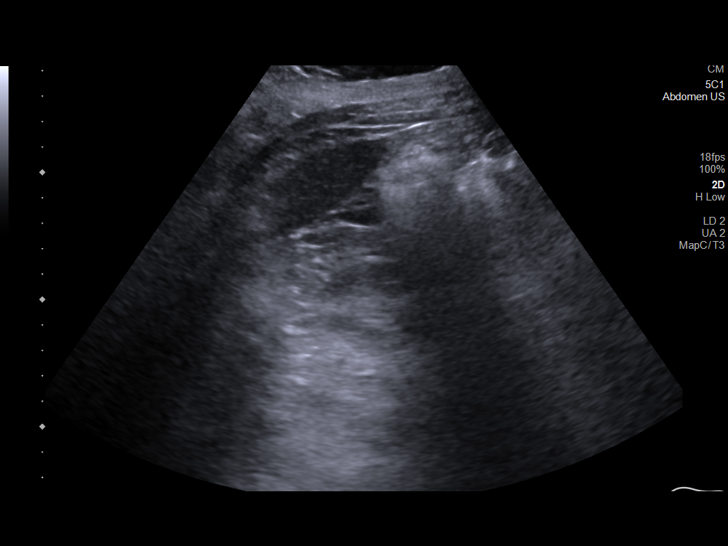
[im 36/57]
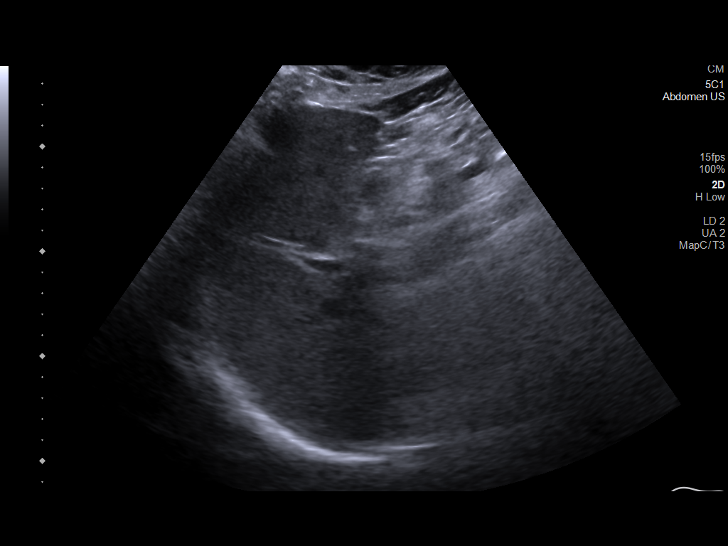
[im 38/57]
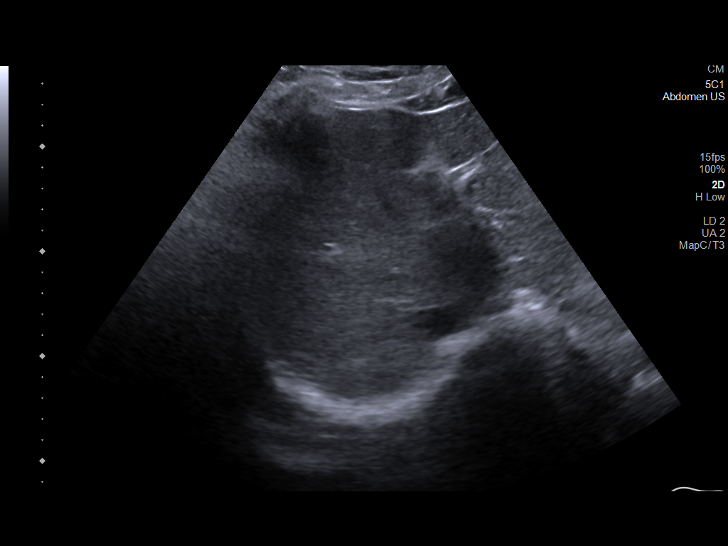
[im 43/57]
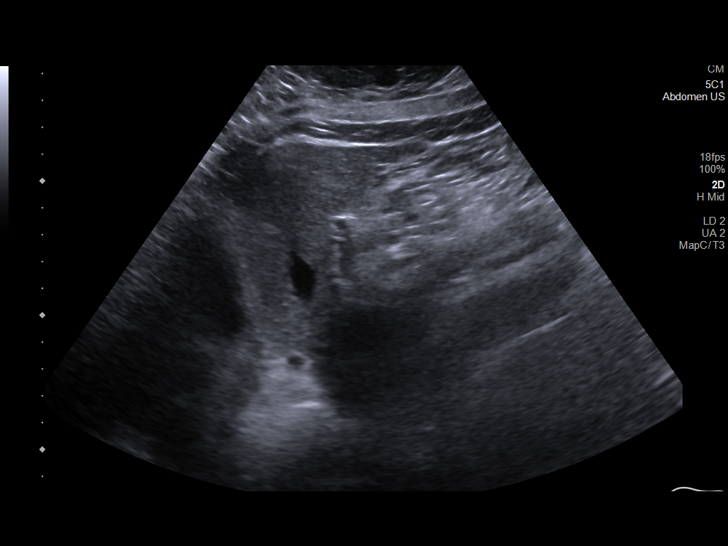
[im 47/57]
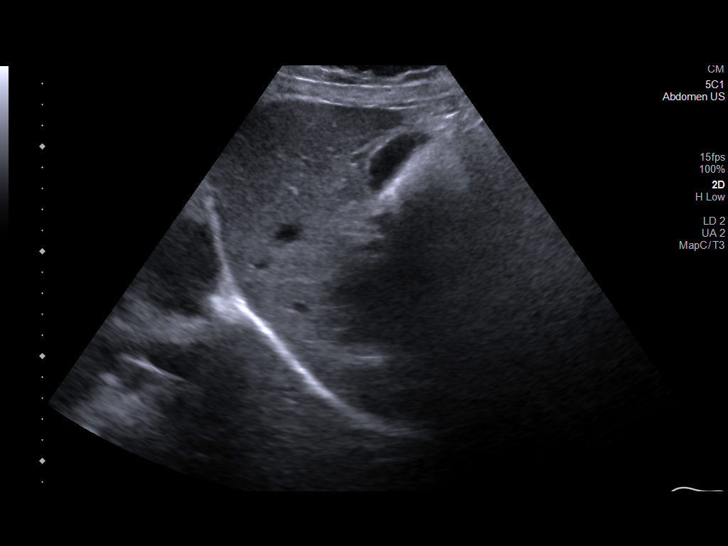
[im 52/57]
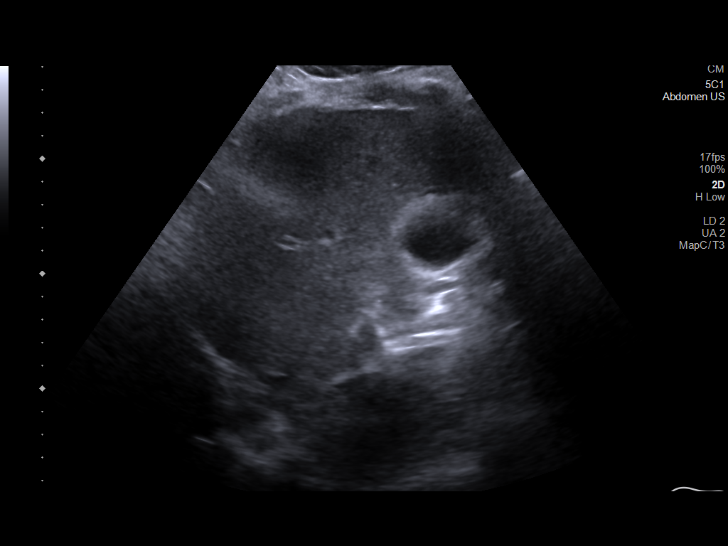
[im 57/57]
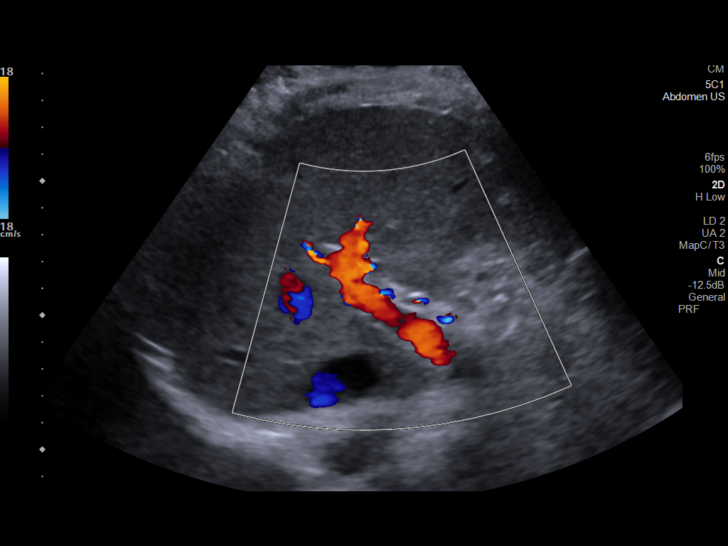

[14 of 25 positions shown; findings below may reference images not displayed]

FINDINGS: Gallbladder:

Gallbladder is well distended with multiple gallstones in a
dependent fashion. No wall thickening or pericholecystic fluid is
noted. Few areas of increased echogenicity within the wall with ring
down artifact are seen consistent with adenomyomatosis.

Common bile duct:

Diameter: 4 mm

Liver:

No focal lesion identified. Within normal limits in parenchymal
echogenicity. Portal vein is patent on color Doppler imaging with
normal direction of blood flow towards the liver.

Other: None.
IMPRESSION: Cholelithiasis without complicating factors.

Changes of adenomyomatosis within the gallbladder wall.

## 2023-08-16 ENCOUNTER — Encounter: Payer: Self-pay | Admitting: Obstetrics & Gynecology

## 2023-08-16 ENCOUNTER — Ambulatory Visit: Admitting: Obstetrics & Gynecology

## 2023-08-16 VITALS — BP 126/82 | HR 71 | Ht 64.0 in | Wt 202.6 lb

## 2023-08-16 DIAGNOSIS — Z3041 Encounter for surveillance of contraceptive pills: Secondary | ICD-10-CM

## 2023-08-16 DIAGNOSIS — Z01419 Encounter for gynecological examination (general) (routine) without abnormal findings: Secondary | ICD-10-CM

## 2023-08-16 MED ORDER — NORGESTIMATE-ETH ESTRADIOL 0.25-35 MG-MCG PO TABS: 1.0000 | ORAL_TABLET | Freq: Every day | ORAL | 4 refills | Status: DC

## 2023-08-16 NOTE — Progress Notes (Signed)
 WELL-WOMAN EXAMINATION Patient name: Nichole [name] MRN [id number]  Date of birth: 1985/06/13 Chief Complaint:   Annual Exam  History of Present Illness:   Nichole [name] is a 38 y.o. [phone] female being seen today for a routine well-woman exam.   On COCs and menses are manageable.  Typically 5 days- denies HMB.  NO dysmenorrhea.  Denies intermenstrual bleeding.  Denies vaginal discharge itching or irritation.  Denies pelvic or abdominal pain.  She reports no acute GYN concerns  Patient's last menstrual period was 07/01/2023 (exact date).  The current method of family planning is OCP (estrogen/progesterone).    Last pap 08/2022.  Last mammogram: NA. Last colonoscopy: NA     08/16/2023    9:59 AM 09/09/2022    9:21 AM 09/07/2021    8:30 AM 08/28/2020   10:30 AM 06/02/2020    1:59 PM  Depression screen PHQ 2/9  Decreased Interest 0 0 0 0 0  Down, Depressed, Hopeless 0 0 0 0 0  PHQ - 2 Score 0 0 0 0 0  Altered sleeping 0 0 0 0   Tired, decreased energy 0 0 0 0   Change in appetite 0 0 0 0   Feeling bad or failure about yourself  0 0 0 0   Trouble concentrating 0 0 0 0   Moving slowly or fidgety/restless 0 0 0 0   Suicidal thoughts 0 0 0 0   PHQ-9 Score 0 0 0 0       Review of Systems:   Pertinent items are noted in HPI Denies any headaches, blurred vision, fatigue, shortness of breath, chest pain, abdominal pain, bowel movements, urination, or intercourse unless otherwise stated above.  Pertinent History Reviewed:  Reviewed past medical,surgical, social and family history.  Reviewed problem list, medications and allergies. Physical Assessment:   Vitals:   08/16/23 1000  BP: 126/82  Pulse: 71  Weight: 202 lb 9.6 oz (91.9 kg)  Height: 5' 4 (1.626 m)  Body mass index is 34.78 kg/m.        Physical Examination:   General appearance - well appearing, and in no distress  Mental status - alert, oriented to person, place, and time  Psych:  She has a normal mood  and affect  Skin - warm and dry, normal color, no suspicious lesions noted  Chest - effort normal, all lung fields clear to auscultation bilaterally  Heart - normal rate and regular rhythm  Neck:  midline trachea, no thyromegaly or nodules  Breasts - breasts appear normal, no suspicious masses, no skin or nipple changes or  axillary nodes  Abdomen - soft, nontender, nondistended, no masses or organomegaly  Pelvic - VULVA: normal appearing vulva with no masses, tenderness or lesions  VAGINA: normal appearing vagina with normal color and discharge, no lesions  CERVIX: normal appearing cervix without discharge or lesions, no CMT  UTERUS: uterus is felt to be normal size, shape, consistency and nontender   Extremities:  No swelling or varicosities noted  Chaperone: Engineer, site     Assessment & Plan:  1) Well-Woman Exam -pap up to date, reviewed screening guidelines  2) Contraceptive -continue with current pill doing well  No orders of the defined types were placed in this encounter.   Meds:  Meds ordered this encounter  Medications   norgestimate -ethinyl estradiol  (ORTHO-CYCLEN) 0.25-35 MG-MCG tablet    Sig: Take 1 tablet by mouth daily.    Dispense:  90 tablet    Refill:  4    Follow-up: Return in about 1 year (around 08/15/2024) for Annual.   Evia Goldsmith, DO Attending Obstetrician & Gynecologist, Faculty Practice Center for [facility], [facility]

## 2023-10-21 ENCOUNTER — Other Ambulatory Visit: Payer: Self-pay | Admitting: Obstetrics & Gynecology

## 2023-10-21 DIAGNOSIS — Z3041 Encounter for surveillance of contraceptive pills: Secondary | ICD-10-CM
